# Patient Record
Sex: Female | Born: 1935 | Race: White | Hispanic: No | Smoking: Never smoker
Health system: Southern US, Community
[De-identification: ages and names within clinical notes are randomized; demographics above are authoritative.]

## PROBLEM LIST (undated history)

## (undated) DIAGNOSIS — I10 Essential (primary) hypertension: Secondary | ICD-10-CM

## (undated) DIAGNOSIS — H3530 Unspecified macular degeneration: Secondary | ICD-10-CM

## (undated) DIAGNOSIS — M810 Age-related osteoporosis without current pathological fracture: Secondary | ICD-10-CM

## (undated) DIAGNOSIS — E860 Dehydration: Secondary | ICD-10-CM

## (undated) DIAGNOSIS — M1990 Unspecified osteoarthritis, unspecified site: Secondary | ICD-10-CM

## (undated) DIAGNOSIS — H409 Unspecified glaucoma: Secondary | ICD-10-CM

## (undated) DIAGNOSIS — K279 Peptic ulcer, site unspecified, unspecified as acute or chronic, without hemorrhage or perforation: Secondary | ICD-10-CM

## (undated) DIAGNOSIS — K219 Gastro-esophageal reflux disease without esophagitis: Secondary | ICD-10-CM

## (undated) DIAGNOSIS — N289 Disorder of kidney and ureter, unspecified: Secondary | ICD-10-CM

## (undated) DIAGNOSIS — F0390 Unspecified dementia without behavioral disturbance: Secondary | ICD-10-CM

## (undated) DIAGNOSIS — I471 Supraventricular tachycardia, unspecified: Secondary | ICD-10-CM

## (undated) HISTORY — PX: CORNEAL TRANSPLANT: SHX108

## (undated) HISTORY — PX: EYE SURGERY: SHX253

## (undated) HISTORY — PX: BREAST LUMPECTOMY: SHX2

## (undated) HISTORY — PX: CATARACT EXTRACTION, BILATERAL: SHX1313

---

## 2015-05-15 ENCOUNTER — Encounter (HOSPITAL_COMMUNITY): Payer: Self-pay

## 2015-05-15 ENCOUNTER — Observation Stay (HOSPITAL_COMMUNITY): Payer: Medicare Other | Admitting: Certified Registered"

## 2015-05-15 ENCOUNTER — Encounter (HOSPITAL_COMMUNITY)
Admission: EM | Disposition: A | Discharge status: Skilled Nursing Facility | Payer: Self-pay | Source: Home / Self Care | Attending: Internal Medicine

## 2015-05-15 ENCOUNTER — Inpatient Hospital Stay (HOSPITAL_COMMUNITY)
Admission: EM | Admit: 2015-05-15 | Discharge: 2015-05-19 | DRG: 907 | Disposition: A | Discharge status: Skilled Nursing Facility | Payer: Medicare Other | Attending: Internal Medicine | Admitting: Internal Medicine

## 2015-05-15 ENCOUNTER — Emergency Department (HOSPITAL_COMMUNITY): Payer: Medicare Other

## 2015-05-15 DIAGNOSIS — T86848 Other complications of corneal transplant: Principal | ICD-10-CM | POA: Diagnosis present

## 2015-05-15 DIAGNOSIS — Y838 Other surgical procedures as the cause of abnormal reaction of the patient, or of later complication, without mention of misadventure at the time of the procedure: Secondary | ICD-10-CM | POA: Diagnosis present

## 2015-05-15 DIAGNOSIS — F0390 Unspecified dementia without behavioral disturbance: Secondary | ICD-10-CM | POA: Diagnosis present

## 2015-05-15 DIAGNOSIS — R296 Repeated falls: Secondary | ICD-10-CM

## 2015-05-15 DIAGNOSIS — R059 Cough, unspecified: Secondary | ICD-10-CM

## 2015-05-15 DIAGNOSIS — H5711 Ocular pain, right eye: Secondary | ICD-10-CM | POA: Diagnosis not present

## 2015-05-15 DIAGNOSIS — S0501XA Injury of conjunctiva and corneal abrasion without foreign body, right eye, initial encounter: Secondary | ICD-10-CM

## 2015-05-15 DIAGNOSIS — S058X1A Other injuries of right eye and orbit, initial encounter: Secondary | ICD-10-CM | POA: Diagnosis present

## 2015-05-15 DIAGNOSIS — Z79899 Other long term (current) drug therapy: Secondary | ICD-10-CM

## 2015-05-15 DIAGNOSIS — T868489 Other complications of corneal transplant, unspecified eye: Secondary | ICD-10-CM | POA: Diagnosis present

## 2015-05-15 DIAGNOSIS — H409 Unspecified glaucoma: Secondary | ICD-10-CM | POA: Diagnosis present

## 2015-05-15 DIAGNOSIS — I10 Essential (primary) hypertension: Secondary | ICD-10-CM | POA: Diagnosis not present

## 2015-05-15 DIAGNOSIS — W1830XA Fall on same level, unspecified, initial encounter: Secondary | ICD-10-CM | POA: Diagnosis present

## 2015-05-15 DIAGNOSIS — Z888 Allergy status to other drugs, medicaments and biological substances status: Secondary | ICD-10-CM

## 2015-05-15 DIAGNOSIS — S0531XA Ocular laceration without prolapse or loss of intraocular tissue, right eye, initial encounter: Secondary | ICD-10-CM | POA: Diagnosis present

## 2015-05-15 DIAGNOSIS — Z66 Do not resuscitate: Secondary | ICD-10-CM | POA: Diagnosis present

## 2015-05-15 DIAGNOSIS — K219 Gastro-esophageal reflux disease without esophagitis: Secondary | ICD-10-CM | POA: Diagnosis not present

## 2015-05-15 DIAGNOSIS — S0530XA Ocular laceration without prolapse or loss of intraocular tissue, unspecified eye, initial encounter: Secondary | ICD-10-CM | POA: Diagnosis present

## 2015-05-15 DIAGNOSIS — R05 Cough: Secondary | ICD-10-CM

## 2015-05-15 DIAGNOSIS — J181 Lobar pneumonia, unspecified organism: Secondary | ICD-10-CM | POA: Diagnosis present

## 2015-05-15 DIAGNOSIS — R451 Restlessness and agitation: Secondary | ICD-10-CM | POA: Diagnosis present

## 2015-05-15 DIAGNOSIS — G9340 Encephalopathy, unspecified: Secondary | ICD-10-CM | POA: Diagnosis present

## 2015-05-15 DIAGNOSIS — Z7982 Long term (current) use of aspirin: Secondary | ICD-10-CM

## 2015-05-15 DIAGNOSIS — D509 Iron deficiency anemia, unspecified: Secondary | ICD-10-CM | POA: Diagnosis present

## 2015-05-15 DIAGNOSIS — H5710 Ocular pain, unspecified eye: Secondary | ICD-10-CM | POA: Diagnosis present

## 2015-05-15 HISTORY — DX: Unspecified glaucoma: H40.9

## 2015-05-15 HISTORY — DX: Unspecified dementia, unspecified severity, without behavioral disturbance, psychotic disturbance, mood disturbance, and anxiety: F03.90

## 2015-05-15 HISTORY — DX: Supraventricular tachycardia: I47.1

## 2015-05-15 HISTORY — PX: RUPTURED GLOBE EXPLORATION AND REPAIR: SHX2366

## 2015-05-15 HISTORY — DX: Unspecified osteoarthritis, unspecified site: M19.90

## 2015-05-15 HISTORY — DX: Unspecified macular degeneration: H35.30

## 2015-05-15 HISTORY — DX: Supraventricular tachycardia, unspecified: I47.10

## 2015-05-15 HISTORY — DX: Peptic ulcer, site unspecified, unspecified as acute or chronic, without hemorrhage or perforation: K27.9

## 2015-05-15 HISTORY — DX: Dehydration: E86.0

## 2015-05-15 HISTORY — DX: Disorder of kidney and ureter, unspecified: N28.9

## 2015-05-15 HISTORY — DX: Age-related osteoporosis without current pathological fracture: M81.0

## 2015-05-15 HISTORY — DX: Essential (primary) hypertension: I10

## 2015-05-15 HISTORY — PX: CORNEAL LACERATION REPAIR: SHX5331

## 2015-05-15 HISTORY — DX: Gastro-esophageal reflux disease without esophagitis: K21.9

## 2015-05-15 LAB — CBC WITH DIFFERENTIAL/PLATELET
BASOS PCT: 1 %
Basophils Absolute: 0.1 10*3/uL (ref 0.0–0.1)
EOS PCT: 1 %
Eosinophils Absolute: 0.1 10*3/uL (ref 0.0–0.7)
HEMATOCRIT: 41.8 % (ref 36.0–46.0)
Hemoglobin: 13.5 g/dL (ref 12.0–15.0)
Lymphocytes Relative: 12 %
Lymphs Abs: 1.1 10*3/uL (ref 0.7–4.0)
MCH: 30.3 pg (ref 26.0–34.0)
MCHC: 32.3 g/dL (ref 30.0–36.0)
MCV: 93.9 fL (ref 78.0–100.0)
MONO ABS: 0.4 10*3/uL (ref 0.1–1.0)
MONOS PCT: 5 %
NEUTROS ABS: 7.5 10*3/uL (ref 1.7–7.7)
Neutrophils Relative %: 81 %
PLATELETS: 298 10*3/uL (ref 150–400)
RBC: 4.45 MIL/uL (ref 3.87–5.11)
RDW: 14.4 % (ref 11.5–15.5)
WBC: 9.2 10*3/uL (ref 4.0–10.5)

## 2015-05-15 LAB — I-STAT CHEM 8, ED
BUN: 16 mg/dL (ref 6–20)
CREATININE: 0.9 mg/dL (ref 0.44–1.00)
Calcium, Ion: 1.15 mmol/L (ref 1.13–1.30)
Chloride: 103 mmol/L (ref 101–111)
GLUCOSE: 135 mg/dL — AB (ref 65–99)
HEMATOCRIT: 45 % (ref 36.0–46.0)
HEMOGLOBIN: 15.3 g/dL — AB (ref 12.0–15.0)
Potassium: 4.1 mmol/L (ref 3.5–5.1)
Sodium: 141 mmol/L (ref 135–145)
TCO2: 26 mmol/L (ref 0–100)

## 2015-05-15 SURGERY — REPAIR, RUPTURE, GLOBE
Anesthesia: General | Site: Eye | Laterality: Right

## 2015-05-15 MED ORDER — FUROSEMIDE 40 MG PO TABS: 40.0000 mg | ORAL_TABLET | Freq: Every day | ORAL | Status: DC

## 2015-05-15 MED ORDER — LIDOCAINE HCL (CARDIAC) 20 MG/ML IV SOLN
INTRAVENOUS | Status: DC | PRN
  Administered 2015-05-15: 50 mg via INTRAVENOUS

## 2015-05-15 MED ORDER — FENTANYL CITRATE (PF) 100 MCG/2ML IJ SOLN: 25.0000 ug | INTRAMUSCULAR | Status: DC | PRN

## 2015-05-15 MED ORDER — ONDANSETRON HCL 4 MG/2ML IJ SOLN: 4.0000 mg | Freq: Four times a day (QID) | INTRAMUSCULAR | Status: DC | PRN

## 2015-05-15 MED ORDER — FENTANYL CITRATE (PF) 100 MCG/2ML IJ SOLN
25.0000 ug | Freq: Once | INTRAMUSCULAR | Status: AC
  Administered 2015-05-15: 25 ug via INTRAVENOUS
  Filled 2015-05-15: qty 2

## 2015-05-15 MED ORDER — CEFTAZIDIME INTRAVITREAL INJECTION 2.25 MG/0.1 ML
2.5000 mg | INTRAVITREAL | Status: AC
  Filled 2015-05-15: qty 0.11

## 2015-05-15 MED ORDER — NAPROXEN 375 MG PO TABS
375.0000 mg | ORAL_TABLET | Freq: Two times a day (BID) | ORAL | Status: DC
  Filled 2015-05-15: qty 1

## 2015-05-15 MED ORDER — LATANOPROST 0.005 % OP SOLN
1.0000 [drp] | Freq: Every day | OPHTHALMIC | Status: DC
  Administered 2015-05-15 – 2015-05-18 (×4): 1 [drp] via OPHTHALMIC
  Filled 2015-05-15: qty 2.5

## 2015-05-15 MED ORDER — PREDNISOLONE ACETATE 1 % OP SUSP
1.0000 [drp] | OPHTHALMIC | Status: DC
Start: 2015-05-16 — End: 2015-05-16

## 2015-05-15 MED ORDER — DONEPEZIL HCL 10 MG PO TABS
10.0000 mg | ORAL_TABLET | Freq: Every day | ORAL | Status: DC
  Administered 2015-05-15 – 2015-05-19 (×5): 10 mg via ORAL
  Filled 2015-05-15 (×5): qty 1

## 2015-05-15 MED ORDER — BUPIVACAINE HCL (PF) 0.75 % IJ SOLN
INTRAMUSCULAR | Status: AC
  Filled 2015-05-15: qty 10

## 2015-05-15 MED ORDER — METOPROLOL TARTRATE 50 MG PO TABS
50.0000 mg | ORAL_TABLET | Freq: Two times a day (BID) | ORAL | Status: DC
  Administered 2015-05-15 – 2015-05-19 (×9): 50 mg via ORAL
  Filled 2015-05-15 (×9): qty 1

## 2015-05-15 MED ORDER — NEOMYCIN-POLYMYXIN-DEXAMETH 3.5-10000-0.1 OP SUSP
1.0000 [drp] | Freq: Three times a day (TID) | OPHTHALMIC | Status: DC
  Administered 2015-05-16 – 2015-05-19 (×10): 1 [drp] via OPHTHALMIC
  Filled 2015-05-15: qty 5

## 2015-05-15 MED ORDER — SENNA 8.6 MG PO TABS
2.0000 | ORAL_TABLET | Freq: Every day | ORAL | Status: DC
  Administered 2015-05-15 – 2015-05-18 (×4): 17.2 mg via ORAL
  Filled 2015-05-15 (×4): qty 2

## 2015-05-15 MED ORDER — CEFTAZIDIME SUBCONJUCTIVAL INJECTION 100 MG/0.5 ML
SUBCONJUNCTIVAL | Status: DC | PRN
  Administered 2015-05-15: 25 mg via SUBCONJUNCTIVAL

## 2015-05-15 MED ORDER — OFLOXACIN 0.3 % OP SOLN
1.0000 [drp] | Freq: Four times a day (QID) | OPHTHALMIC | Status: DC
  Administered 2015-05-16 – 2015-05-19 (×14): 1 [drp] via OPHTHALMIC
  Filled 2015-05-15: qty 5

## 2015-05-15 MED ORDER — ONDANSETRON HCL 4 MG/2ML IJ SOLN
4.0000 mg | Freq: Once | INTRAMUSCULAR | Status: AC
  Administered 2015-05-15: 4 mg via INTRAVENOUS
  Filled 2015-05-15: qty 2

## 2015-05-15 MED ORDER — LIDOCAINE HCL 2 % IJ SOLN
INTRAMUSCULAR | Status: AC
  Filled 2015-05-15: qty 20

## 2015-05-15 MED ORDER — VANCOMYCIN SUBCONJUNCTIVAL INJECTION 25 MG/0.5 ML
50.0000 mg | INTRAOCULAR | Status: AC
  Filled 2015-05-15: qty 1

## 2015-05-15 MED ORDER — FENTANYL CITRATE (PF) 250 MCG/5ML IJ SOLN
INTRAMUSCULAR | Status: AC
  Filled 2015-05-15: qty 5

## 2015-05-15 MED ORDER — PHENYLEPHRINE HCL 10 MG/ML IJ SOLN
10.0000 mg | INTRAVENOUS | Status: DC | PRN
  Administered 2015-05-15: 25 ug/min via INTRAVENOUS

## 2015-05-15 MED ORDER — BUPIVACAINE HCL (PF) 0.75 % IJ SOLN
INTRAMUSCULAR | Status: DC | PRN
  Administered 2015-05-15: 3 mL

## 2015-05-15 MED ORDER — 0.9 % SODIUM CHLORIDE (POUR BTL) OPTIME
TOPICAL | Status: DC | PRN
  Administered 2015-05-15: 500 mL

## 2015-05-15 MED ORDER — SUGAMMADEX SODIUM 200 MG/2ML IV SOLN
INTRAVENOUS | Status: DC | PRN
  Administered 2015-05-15: 140 mg via INTRAVENOUS

## 2015-05-15 MED ORDER — ROCURONIUM BROMIDE 100 MG/10ML IV SOLN
INTRAVENOUS | Status: DC | PRN
  Administered 2015-05-15: 10 mg via INTRAVENOUS
  Administered 2015-05-15: 40 mg via INTRAVENOUS

## 2015-05-15 MED ORDER — NEOMYCIN-POLYMYXIN-DEXAMETH 3.5-10000-0.1 OP OINT
TOPICAL_OINTMENT | OPHTHALMIC | Status: AC
  Filled 2015-05-15: qty 3.5

## 2015-05-15 MED ORDER — PANTOPRAZOLE SODIUM 40 MG PO TBEC
40.0000 mg | DELAYED_RELEASE_TABLET | Freq: Every day | ORAL | Status: DC
  Administered 2015-05-16 – 2015-05-19 (×4): 40 mg via ORAL
  Filled 2015-05-15 (×4): qty 1

## 2015-05-15 MED ORDER — SODIUM CHLORIDE 0.9 % IV SOLN
INTRAVENOUS | Status: DC | PRN
  Administered 2015-05-15: 18:00:00 via INTRAVENOUS

## 2015-05-15 MED ORDER — ACETAMINOPHEN 325 MG PO TABS
650.0000 mg | ORAL_TABLET | Freq: Four times a day (QID) | ORAL | Status: DC | PRN
Start: 2015-05-15 — End: 2015-05-19
  Administered 2015-05-17: 650 mg via ORAL
  Filled 2015-05-15: qty 2

## 2015-05-15 MED ORDER — BSS IO SOLN
INTRAOCULAR | Status: AC
  Filled 2015-05-15: qty 15

## 2015-05-15 MED ORDER — VANCOMYCIN INTRAVITREAL INJECTION 1 MG/0.1 ML
INTRAOCULAR | Status: DC | PRN
  Administered 2015-05-15: 50 mg via INTRAVITREAL

## 2015-05-15 MED ORDER — PHENYLEPHRINE HCL 10 MG/ML IJ SOLN
INTRAMUSCULAR | Status: DC | PRN
  Administered 2015-05-15 (×2): 80 ug via INTRAVENOUS

## 2015-05-15 MED ORDER — ONDANSETRON HCL 4 MG PO TABS: 4.0000 mg | ORAL_TABLET | Freq: Four times a day (QID) | ORAL | Status: DC | PRN

## 2015-05-15 MED ORDER — FERROUS SULFATE 325 (65 FE) MG PO TABS
325.0000 mg | ORAL_TABLET | Freq: Every day | ORAL | Status: DC
  Administered 2015-05-16 – 2015-05-19 (×4): 325 mg via ORAL
  Filled 2015-05-15 (×4): qty 1

## 2015-05-15 MED ORDER — MEPERIDINE HCL 25 MG/ML IJ SOLN: 6.2500 mg | INTRAMUSCULAR | Status: DC | PRN

## 2015-05-15 MED ORDER — BSS IO SOLN
INTRAOCULAR | Status: DC | PRN
  Administered 2015-05-15: 30 mL via INTRAOCULAR

## 2015-05-15 MED ORDER — SODIUM CHLORIDE 0.9 % IV SOLN
INTRAVENOUS | Status: DC
  Administered 2015-05-15: 18:00:00 via INTRAVENOUS

## 2015-05-15 MED ORDER — LEVOFLOXACIN IN D5W 500 MG/100ML IV SOLN
500.0000 mg | Freq: Once | INTRAVENOUS | Status: AC
  Administered 2015-05-15: 500 mg via INTRAVENOUS
  Filled 2015-05-15: qty 100

## 2015-05-15 MED ORDER — LIDOCAINE HCL 2 % IJ SOLN
INTRAMUSCULAR | Status: DC | PRN
  Administered 2015-05-15: 3 mL

## 2015-05-15 MED ORDER — FENTANYL CITRATE (PF) 100 MCG/2ML IJ SOLN
INTRAMUSCULAR | Status: DC | PRN
  Administered 2015-05-15: 50 ug via INTRAVENOUS
  Administered 2015-05-15: 100 ug via INTRAVENOUS

## 2015-05-15 MED ORDER — MAGNESIUM HYDROXIDE 400 MG/5ML PO SUSP: 30.0000 mL | Freq: Every evening | ORAL | Status: DC | PRN

## 2015-05-15 MED ORDER — STERILE WATER FOR IRRIGATION IR SOLN
Status: DC | PRN
  Administered 2015-05-15: 300 mL

## 2015-05-15 MED ORDER — ONDANSETRON HCL 4 MG/2ML IJ SOLN
INTRAMUSCULAR | Status: DC | PRN
  Administered 2015-05-15: 4 mg via INTRAVENOUS

## 2015-05-15 MED ORDER — ATROPINE SULFATE 1 % OP SOLN
1.0000 [drp] | Freq: Three times a day (TID) | OPHTHALMIC | Status: DC
  Administered 2015-05-16 – 2015-05-19 (×10): 1 [drp] via OPHTHALMIC
  Filled 2015-05-15: qty 5

## 2015-05-15 MED ORDER — ACETAMINOPHEN 650 MG RE SUPP
650.0000 mg | Freq: Four times a day (QID) | RECTAL | Status: DC | PRN
Start: 2015-05-15 — End: 2015-05-19

## 2015-05-15 MED ORDER — SUGAMMADEX SODIUM 200 MG/2ML IV SOLN
INTRAVENOUS | Status: AC
  Filled 2015-05-15: qty 2

## 2015-05-15 MED ORDER — ASPIRIN EC 81 MG PO TBEC: 81.0000 mg | DELAYED_RELEASE_TABLET | Freq: Every day | ORAL | Status: DC

## 2015-05-15 MED ORDER — PROPOFOL 10 MG/ML IV BOLUS
INTRAVENOUS | Status: DC | PRN
  Administered 2015-05-15: 100 mg via INTRAVENOUS

## 2015-05-15 SURGICAL SUPPLY — 58 items
ACCESSORY FRAGMATOME (MISCELLANEOUS) IMPLANT
APPLICATOR DR MATTHEWS STRL (MISCELLANEOUS) ×3 IMPLANT
BANDAGE EYE OVAL (MISCELLANEOUS) ×3 IMPLANT
BLADE 10 SAFETY STRL DISP (BLADE) IMPLANT
BLADE EYE CATARACT 19 1.4 BEAV (BLADE) IMPLANT
BLADE MVR KNIFE 19G (BLADE) IMPLANT
BLADE SURG 15 STRL LF DISP TIS (BLADE) IMPLANT
BLADE SURG 15 STRL SS (BLADE)
CANNULA ANT CHAM MAIN (OPHTHALMIC RELATED) IMPLANT
CANNULA ANTERIOR CHAMBER 27GA (MISCELLANEOUS) ×3 IMPLANT
CANNULA DUAL BORE 23G (CANNULA) IMPLANT
CANNULA SUBRETINAL FLUID 20G (BLADE) IMPLANT
CORDS BIPOLAR (ELECTRODE) ×3 IMPLANT
COVER MAYO STAND STRL (DRAPES) IMPLANT
COVER SURGICAL LIGHT HANDLE (MISCELLANEOUS) ×3 IMPLANT
DRAPE OPHTHALMIC 77X100 STRL (CUSTOM PROCEDURE TRAY) ×3 IMPLANT
ERASER HMR WETFIELD 23G BP (MISCELLANEOUS) IMPLANT
FILTER BLUE MILLIPORE (MISCELLANEOUS) IMPLANT
GOWN STRL REUS W/ TWL LRG LVL3 (GOWN DISPOSABLE) ×2 IMPLANT
GOWN STRL REUS W/TWL LRG LVL3 (GOWN DISPOSABLE) ×4
KIT BASIN OR (CUSTOM PROCEDURE TRAY) ×3 IMPLANT
KIT PERFLUORON PROCEDURE 5ML (MISCELLANEOUS) IMPLANT
KIT ROOM TURNOVER OR (KITS) ×3 IMPLANT
KNIFE CRESCENT 1.75 EDGEAHEAD (BLADE) IMPLANT
KNIFE GRIESHABER SHARP 2.5MM (MISCELLANEOUS) IMPLANT
NEEDLE 18GX1X1/2 (RX/OR ONLY) (NEEDLE) IMPLANT
NEEDLE 25GX 5/8IN NON SAFETY (NEEDLE) IMPLANT
NEEDLE 27GAX1X1/2 (NEEDLE) IMPLANT
NEEDLE HYPO 30X.5 LL (NEEDLE) IMPLANT
NS IRRIG 1000ML POUR BTL (IV SOLUTION) ×3 IMPLANT
PACK VITRECTOMY CUSTOM (CUSTOM PROCEDURE TRAY) ×3 IMPLANT
PACK VITRECTOMY PIC MCHSVP (PACKS) IMPLANT
PAD ARMBOARD 7.5X6 YLW CONV (MISCELLANEOUS) ×6 IMPLANT
PROBE LASER ILLUM FLEX CVD 25G (OPHTHALMIC) ×3 IMPLANT
REPL STRA BRUSH NEEDLE (NEEDLE) IMPLANT
RESERVOIR BACK FLUSH (MISCELLANEOUS) IMPLANT
ROLLS DENTAL (MISCELLANEOUS) IMPLANT
SCRAPER DIAMOND DUST MEMBRANE (MISCELLANEOUS) IMPLANT
SPEAR EYE SURG WECK-CEL (MISCELLANEOUS) ×3 IMPLANT
STOPCOCK 4 WAY LG BORE MALE ST (IV SETS) IMPLANT
SUT CHROMIC 7 0 TG140 8 (SUTURE) IMPLANT
SUT ETHILON 10 0 BV100 4 (SUTURE) IMPLANT
SUT ETHILON 10 0 BV75 4 (SUTURE) IMPLANT
SUT ETHILON 10 0 CS140 6 (SUTURE) ×6 IMPLANT
SUT ETHILON 9 0 TG140 8 (SUTURE) IMPLANT
SUT SILK 2 0 (SUTURE)
SUT SILK 2-0 18XBRD TIE 12 (SUTURE) IMPLANT
SUT VICRYL 7 0 TG140 8 (SUTURE) IMPLANT
SWAB COLLECTION DEVICE MRSA (MISCELLANEOUS) IMPLANT
SYR 20CC LL (SYRINGE) IMPLANT
SYR 50ML SLIP (SYRINGE) IMPLANT
SYR 5ML LL (SYRINGE) IMPLANT
SYR TB 1ML LUER SLIP (SYRINGE) IMPLANT
SYRINGE 10CC LL (SYRINGE) IMPLANT
TOWEL OR 17X24 6PK STRL BLUE (TOWEL DISPOSABLE) ×3 IMPLANT
TUBE ANAEROBIC SPECIMEN COL (MISCELLANEOUS) IMPLANT
WATER STERILE IRR 1000ML POUR (IV SOLUTION) ×3 IMPLANT
WIPE INSTRUMENT VISIWIPE 73X73 (MISCELLANEOUS) ×3 IMPLANT

## 2015-05-15 NOTE — ED Notes (Signed)
Per [facility], Pt is coming from [facility]. Pt was getting out of bed and fell. Right eye hit the plastic trash can. Left hand had bruising and skin tear. Pt has a laceration to her right eye per EMS and facility. Pt has vomiting starting during transport. Pt has Hx of Dementia, GERD, HTN, Renal Insufficiency, Gastric ulcers, Osteoporosis. Vitals per EMS: 170/92, 94% on RA, 88 HR.

## 2015-05-15 NOTE — Anesthesia Preprocedure Evaluation (Signed)
Anesthesia Evaluation  Patient identified by MRN, date of birth, ID band Patient awake    Reviewed: Allergy & Precautions, NPO status , Patient's Chart, lab work & pertinent test results, reviewed documented beta blocker date and time   Airway Mallampati: III  TM Distance: <3 FB Neck ROM: Full  Mouth opening: Limited Mouth Opening  Dental no notable dental hx.    Pulmonary neg pulmonary ROS,    Pulmonary exam normal breath sounds clear to auscultation       Cardiovascular hypertension, Pt. on home beta blockers Normal cardiovascular exam Rhythm:Regular Rate:Normal     Neuro/Psych negative neurological ROS  negative psych ROS   GI/Hepatic Neg liver ROS, PUD, GERD  ,  Endo/Other  negative endocrine ROS  Renal/GU Renal disease     Musculoskeletal negative musculoskeletal ROS (+)   Abdominal   Peds  Hematology negative hematology ROS (+)   Anesthesia Other Findings   Reproductive/Obstetrics                             Anesthesia Physical Anesthesia Plan  ASA: II and emergent  Anesthesia Plan: General   Post-op Pain Management:    Induction: Intravenous  Airway Management Planned: Oral ETT and Video Laryngoscope Planned  Additional Equipment:   Intra-op Plan:   Post-operative Plan: Extubation in OR  Informed Consent: I have reviewed the patients History and Physical, chart, labs and discussed the procedure including the risks, benefits and alternatives for the proposed anesthesia with the patient or authorized representative who has indicated his/her understanding and acceptance.   Dental advisory given  Plan Discussed with: CRNA  Anesthesia Plan Comments:         Anesthesia Quick Evaluation

## 2015-05-15 NOTE — ED Notes (Signed)
MD made aware of patient's condition. Came to bedside to see patient

## 2015-05-15 NOTE — ED Provider Notes (Signed)
CSN: [id number]     Arrival date & time 05/15/15  0905 History   First MD Initiated Contact with Patient 05/15/15 0901     Chief Complaint  Patient presents with  . Fall   Level V caveat due to dementia Patient is a 82 y.o. female presenting with fall. The history is provided by the patient.  Fall This is a new problem.  Patient with reported fall. Patient is really without complaints maybe complaining of pain in her right eye. 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 She also has rather severe macular degeneration. She is a DO NOT RESUSCITATE. Patient did vomit for EMS.  Past Medical History  Diagnosis Date  . Renal disorder     Renal Insufficiency  . Dehydration   . GERD (gastroesophageal reflux disease)   . Peptic ulcer   . Osteoporosis     With Compression Fx  . Supraventricular tachycardia (HCC)    History reviewed. No pertinent past surgical history. History reviewed. No pertinent family history. Social History  Substance Use Topics  . Smoking status: Unknown If Ever Smoked  . Smokeless tobacco: None  . Alcohol Use: No   OB History    No data available     Review of Systems  Unable to perform ROS: Dementia      Allergies  Diltiazem; Norvasc; and Verapamil  Home Medications   Prior to Admission medications   Medication Sig Start Date End Date Taking? Authorizing Provider  aspirin EC 81 MG tablet Take 81 mg by mouth daily.   Yes Historical Provider, MD  diclofenac sodium (VOLTAREN) 1 % GEL Apply 2 g topically 4 (four) times daily as needed (pain).   Yes Historical Provider, MD  donepezil (ARICEPT) 10 MG tablet Take 10 mg by mouth daily. 04/28/15  Yes Historical Provider, MD  ferrous sulfate 325 (65 FE) MG tablet Take 325 mg by mouth daily with breakfast.   Yes Historical Provider, MD  furosemide (LASIX) 40 MG tablet Take 40 mg by mouth daily. 04/28/15  Yes Historical Provider, MD  latanoprost (XALATAN) 0.005 % ophthalmic solution Place  1 drop into both eyes at bedtime. 04/07/15  Yes Historical Provider, MD  magnesium hydroxide (MILK OF MAGNESIA) 400 MG/5ML suspension Take 30 mLs by mouth at bedtime as needed for mild constipation.   Yes Historical Provider, MD  metoprolol (LOPRESSOR) 50 MG tablet Take 50 mg by mouth every 12 (twelve) hours. 04/28/15  Yes Historical Provider, MD  NAPROXEN 375 MG TBEC EC tablet Take 375 mg by mouth 2 (two) times daily. 04/28/15  Yes Historical Provider, MD  OVER THE COUNTER MEDICATION Take 1 Can by mouth 3 (three) times daily with meals. Mighty Shakes   Yes Historical Provider, MD  pantoprazole (PROTONIX) 40 MG tablet Take 40 mg by mouth daily. 04/28/15  Yes Historical Provider, MD  potassium chloride (MICRO-K) 10 MEQ CR capsule Take 20 mEq by mouth daily. 04/28/15  Yes Historical Provider, MD  senna (SENOKOT) 8.6 MG TABS tablet Take 2 tablets by mouth at bedtime.   Yes Historical Provider, MD  SIMBRINZA 1-0.2 % SUSP Place 1 drop into both eyes 3 (three) times daily. 04/07/15  Yes Historical Provider, MD   BP 173/127 mmHg  Pulse 67  Temp(Src) 98.2 F (36.8 C) (Oral)  Resp 18  SpO2 90% Physical Exam  Constitutional: She appears well-developed.  HENT:  Abrasion over bridge of nose with mild tenderness.  Eyes:  Right cornea appears to  have avulsed  and have an open globe. extraocular movements intact. Decreased vision out of left eye which is reportedly chronic. No vision out of her right eye.  Neck: Neck supple.  Cardiovascular: Normal rate.   Pulmonary/Chest: Effort normal.  Abdominal: There is no tenderness.  Musculoskeletal:  No tenderness over extremities.  Neurological: She is alert.  Patient is at her baseline dementia.  Skin: Skin is warm.      ED Course  Procedures (including critical care time) Labs Review Labs Reviewed  I-STAT CHEM 8, ED - Abnormal; Notable for the following:    Glucose, Bld 135 (*)    Hemoglobin 15.3 (*)    All other components within normal limits  CBC  WITH DIFFERENTIAL/PLATELET    Imaging Review Ct Head Wo Contrast  05/15/2015  CLINICAL DATA:  Status post fall.  Laceration of the right eye. EXAM: CT HEAD WITHOUT CONTRAST CT MAXILLOFACIAL WITHOUT CONTRAST TECHNIQUE: Multidetector CT imaging of the head and maxillofacial structures were performed using the standard protocol without intravenous contrast. Multiplanar CT image reconstructions of the maxillofacial structures were also generated. COMPARISON:  None. FINDINGS: CT HEAD FINDINGS There is no evidence of mass effect, midline shift, or extra-axial fluid collections. There is no evidence of a space-occupying lesion or intracranial hemorrhage. There is no evidence of a cortical-based area of acute infarction. There is generalized cerebral atrophy. There is periventricular white matter low attenuation likely secondary to microangiopathy. The ventricles and sulci are appropriate for the patient's age. The basal cisterns are patent. Visualized portions of the orbits are unremarkable. The visualized portions of the paranasal sinuses and mastoid air cells are unremarkable. The osseous structures are unremarkable. CT MAXILLOFACIAL FINDINGS There is hyperdense material within the right globe most concerning for a a hemorrhagic choroidal detachment. The orbital walls are intact. The orbital floors are intact. The maxilla is intact. The mandible is intact. The zygomatic arches are intact. The nasal septum is midline. There is no nasal bone fracture. 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 There is degenerative disc disease with disc height loss at C2-3, C3-4, C4-5 and C5-6. The paranasal sinuses are clear. The visualized portions of the mastoid sinuses are well aerated. IMPRESSION: 1. No acute intracranial pathology. 2. 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 Ophthalmology consultation is recommended. Electronically Signed   By:  Elige KoHetal  Patel   On: 05/15/2015 11:15   Ct Maxillofacial Wo Cm  05/15/2015  CLINICAL DATA:  Status post fall.  Laceration of the right eye. EXAM: CT HEAD WITHOUT CONTRAST CT MAXILLOFACIAL WITHOUT CONTRAST TECHNIQUE: Multidetector CT imaging of the head and maxillofacial structures were performed using the standard protocol without intravenous contrast. Multiplanar CT image reconstructions of the maxillofacial structures were also generated. COMPARISON:  None. FINDINGS: CT HEAD FINDINGS There is no evidence of mass effect, midline shift, or extra-axial fluid collections. There is no evidence of a space-occupying lesion or intracranial hemorrhage. There is no evidence of a cortical-based area of acute infarction. There is generalized cerebral atrophy. There is periventricular white matter low attenuation likely secondary to microangiopathy. The ventricles and sulci are appropriate for the patient's age. The basal cisterns are patent. Visualized portions of the orbits are unremarkable. The visualized portions of the paranasal sinuses and mastoid air cells are unremarkable. The osseous structures are unremarkable. CT MAXILLOFACIAL FINDINGS There is hyperdense material within the right globe most concerning for a a hemorrhagic choroidal detachment. The orbital walls are intact. The orbital floors are  intact. The maxilla is intact. The mandible is intact. The zygomatic arches are intact. The nasal septum is midline. There is no nasal bone fracture. 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 There is degenerative disc disease with disc height loss at C2-3, C3-4, C4-5 and C5-6. The paranasal sinuses are clear. The visualized portions of the mastoid sinuses are well aerated. IMPRESSION: 1. No acute intracranial pathology. 2. 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 Ophthalmology consultation is recommended. Electronically Signed   By: Elige Ko   On: 05/15/2015 11:15   I have personally reviewed and evaluated these images and lab results as part of my medical decision-making.   EKG Interpretation None      MDM   Final diagnoses:  Other complication of corneal transplant    Patient with fall and open globe. Previous corneal transplant. Seen in the ER by Dr. Dione Booze. He recommends transfer to [facility]. Discussed with [facility] Dr.Rubino who discussed with Dr. Dione Booze and recommends no transfer to [facility]. Then discussed with [facility], discussed with Dr. Modesto Charon who also discussed with Dr. Dione Booze and reportedly wants patient to stay here. Dr. Dione Booze will take the patient to the operating room.    Benjiman Core, MD 05/15/15 1247

## 2015-05-15 NOTE — Anesthesia Postprocedure Evaluation (Signed)
Anesthesia Post Note  Patient: Crystal RuffHilda Hartman  Procedure(s) Performed: Procedure(s) (LRB): TRAUMATIC OPEN GLOBE REPAIR (Right)  Patient location during evaluation: PACU Anesthesia Type: General Level of consciousness: awake and alert Pain management: pain level controlled Vital Signs Assessment: post-procedure vital signs reviewed and stable Respiratory status: spontaneous breathing, nonlabored ventilation, respiratory function stable and patient connected to nasal cannula oxygen Cardiovascular status: blood pressure returned to baseline and stable Postop Assessment: no signs of nausea or vomiting Anesthetic complications: no    Last Vitals:  Filed Vitals:   05/15/15 2048 05/15/15 2115  BP: 155/85 166/88  Pulse:  93  Temp: 36.7 C   Resp: 19 19    Last Pain:  Filed Vitals:   05/15/15 2119  PainSc: 0-No pain                 [name]

## 2015-05-15 NOTE — Op Note (Signed)
Ophthalmology Operative Report  Leighton RuffHilda Crevier 02/22/1918 82 year old female  Date of Surgery: 03/17/2015  Surgeon: Olivia Canterichard Scott Yaritsa Savarino, MD  Background/Indications: Ms. Rod CanBrower is a 82 year old woman with a complex ocular history that includes bilateral penetrating keratoplasty 20+ years ago who presents s/p trauma in which case she sustained near-complete dehiscence of the corneal graft in her right eye and expulsion of intraocular contents 2/2 mechanical fall. The laceration was exactly located at the graft-host interface from 3-12 o'clock. On initial exam, she was noted to have no light perception visual acuity OD and large rAPD by reverse. There was no view of the anterior chamber, as the intraocular contents were expulsed and located on her upper mid-face. Her primary ophthalmologist, Georgann Housekeeperobert Handley, was notified, and it became clear that she had very poor vision OD from longstanding glaucoma and macular degeneration. After discussion with Dr. Marina GravelHandley, and the patient's son Van Clines(Phillip Duval), the decision was made to perform globe repair versus primary evisceration. The risks, benefits, and alternatives to surgery were discussed with her son (power-of-attorney), and he elected that she undergo surgical repair with evisceration if adequate repair could not be achieved.   Procedures:  1) Corneal laceration repair, right eye  Anesthesia:  GETA with MAC  Complications:  None   Description:  Pt was brought into the surgical suite, and general anesthesia was administered by the anesthesia team. She was prepped and draped in the usual sterile fashion for ophthalmic surgery. Westcotts and 0.12s were used to carefully excise exposed uvea in order to get better visualization of the cornea graft and host. Remaining uvea was carefully reposited back into the eye. Next, the eye was closed with 10-0 nylon sutures. Nine sutures were replaced radially from about 4-12 o'clock roughly equidistant. Tension was  titrated by way of a slip-knot, and, when deemed adequate, all knots were rotated. The eye was then pressurized with BSS on a cannula and noted to be watertight at normotensive pressure. Next, roughly 0.25 mL of vancomycin (50 mg/mL) and 0.25 mL of ceftazadime (25 mg/mL) were injected into the eye via cannula. The lid speculum and drape were then removed, and roughly 5 mL block (1:1 lidocaine 2% and bupivicaine 0.75%) were adminstered into the retro- and peribulbar space. Antibiotic/steroid ointment was placed on the eye, and a pressure patch and shield was placed. The patient was then awakened and transferred to the PACU in stable condition.  Post-op Instructions  Pt okay for discharge from ophthalmology's perspective once deemed stable by hospitalist service (appreciate their assistance in the care of this pleasant 82 year old woman). Pt will need to be discharged on:  1) Prednisolone acetate 1% solution 1 drop right eye q4H  2) Ocuflox ophthalmic solution 1 drop right eye QID  3) Atropine 1% ophthalmic solution 1 drop right eye TID  4) Maxitrol (generic) ointment (with patient from OR) small amount right eye TID  Please feel free to notify me by cell phone at [phone] at any time if there are any questions. I will plan on seeing the patient tomorrow at 0700AM prior to discharge.  R Fabian SharpScott Cotton Beckley, MD   [address] [place]  [address]  [place], [place] [id number]

## 2015-05-15 NOTE — ED Notes (Signed)
Opthamology at the bedside.

## 2015-05-15 NOTE — H&P (Signed)
[facility] History and Physical   Patient: Crystal Hartman ZOX:[id number]   PCP: Marylen PontoHOLT,LYNLEY S, MD DOB: 12/31/1918   DOA: 05/15/2015   DOS: 05/15/2015   DOS: the patient was seen and examined on 05/15/2015  Referring physician: ER Chief Complaint: Fall with injury to right eye  HPI: Crystal Hartman is a 82 y.o. female with Past medical history of dementia, GERD, SVT. The patient presented with complaints of a fall. The history is limited as is the patient has dementia. As per the family they were called since the patient had a fall. As per the documentation the patient had a mechanical fall with injury to her right eye due to a plastic can. Patient apparently had right globe injury. [facility] as well as do ophthalmology here decided to take the patient to or. The patient underwent the procedure and the patient was seen after the procedure. The patient tolerated the procedure very well. Denies having any complaints of chest pain or abdominal pain nausea or vomiting diarrhea or back pain. Denies having any focal deficit as well. Family denies any recent change in her medication. Patient lives alone at an assisted living facility  The patient is coming from ALF.  At her baseline ambulates Occasionally with walker And is independent for most of her ADL; manages her medication on her own.  Review of Systems: as mentioned in the history of present illness.  A comprehensive review of the other systems is negative.  Past Medical History  Diagnosis Date  . Renal disorder     Renal Insufficiency  . Dehydration   . GERD (gastroesophageal reflux disease)   . Peptic ulcer   . Osteoporosis     With Compression Fx  . Supraventricular tachycardia (HCC)    History reviewed. No pertinent past surgical history. Social History:  reports that she does not drink alcohol or use illicit drugs. Her tobacco history is not on file.  Allergies  Allergen Reactions  .  Diltiazem   . Norvasc [Amlodipine Besylate]   . Verapamil     History reviewed. No pertinent family history.  Prior to Admission medications   Medication Sig Start Date End Date Taking? Authorizing Provider  aspirin EC 81 MG tablet Take 81 mg by mouth daily.   Yes Historical Provider, MD  diclofenac sodium (VOLTAREN) 1 % GEL Apply 2 g topically 4 (four) times daily as needed (pain).   Yes Historical Provider, MD  donepezil (ARICEPT) 10 MG tablet Take 10 mg by mouth daily. 04/28/15  Yes Historical Provider, MD  ferrous sulfate 325 (65 FE) MG tablet Take 325 mg by mouth daily with breakfast.   Yes Historical Provider, MD  furosemide (LASIX) 40 MG tablet Take 40 mg by mouth daily. 04/28/15  Yes Historical Provider, MD  latanoprost (XALATAN) 0.005 % ophthalmic solution Place 1 drop into both eyes at bedtime. 04/07/15  Yes Historical Provider, MD  magnesium hydroxide (MILK OF MAGNESIA) 400 MG/5ML suspension Take 30 mLs by mouth at bedtime as needed for mild constipation.   Yes Historical Provider, MD  metoprolol (LOPRESSOR) 50 MG tablet Take 50 mg by mouth every 12 (twelve) hours. 04/28/15  Yes Historical Provider, MD  NAPROXEN 375 MG TBEC EC tablet Take 375 mg by mouth 2 (two) times daily. 04/28/15  Yes Historical Provider, MD  OVER THE COUNTER MEDICATION Take 1 Can by mouth 3 (three) times daily with meals. Mighty Shakes   Yes Historical Provider, MD  pantoprazole (PROTONIX) 40 MG tablet  Take 40 mg by mouth daily. 04/28/15  Yes Historical Provider, MD  potassium chloride (MICRO-K) 10 MEQ CR capsule Take 20 mEq by mouth daily. 04/28/15  Yes Historical Provider, MD  senna (SENOKOT) 8.6 MG TABS tablet Take 2 tablets by mouth at bedtime.   Yes Historical Provider, MD  SIMBRINZA 1-0.2 % SUSP Place 1 drop into both eyes 3 (three) times daily. 04/07/15  Yes Historical Provider, MD    Physical Exam: Filed Vitals:   05/15/15 2048 05/15/15 2115 05/15/15 2125 05/15/15 2149  BP: 155/85 166/88 151/95 183/82    Pulse:  93 92 91  Temp: 98 F (36.7 C)  98 F (36.7 C) 98.5 F (36.9 C)  TempSrc:      Resp: SpO2: 92% 95% 96% 94%    General: Alert, Awake and Oriented to Time, Place and Person. Appear in mild distress Eyes: Right eye under patch ENT: Oral Mucosa clear moist. Neck: no JVD Cardiovascular: S1 and S2 Present, no Murmur, Peripheral Pulses Present Respiratory: Bilateral Air entry equal and Decreased,  Clear to Auscultation, no Crackles, no wheezes Abdomen: Bowel Sound present, Soft and no tenderness Skin: no Rash Extremities: no Pedal edema, no calf tenderness Neurologic: Grossly no focal neuro deficit. Labs on Admission:  CBC:  Recent Labs Lab 05/15/15 0912 05/15/15 0947  WBC 9.2  --   NEUTROABS 7.5  --   HGB 13.5 15.3*  HCT 41.8 45.0  MCV 93.9  --   PLT 298  --     CMP     Component Value Date/Time   NA 141 05/15/2015 0947   K 4.1 05/15/2015 0947   CL 103 05/15/2015 0947   GLUCOSE 135* 05/15/2015 0947   BUN 16 05/15/2015 0947   CREATININE 0.90 05/15/2015 0947    No results for input(s): CKTOTAL, CKMB, CKMBINDEX, TROPONINI in the last 168 hours. BNP (last 3 results) No results for input(s): BNP in the last 8760 hours.  ProBNP (last 3 results) No results for input(s): PROBNP in the last 8760 hours.   Radiological Exams on Admission: Ct Head Wo Contrast  05/15/2015  CLINICAL DATA:  Status post fall.  Laceration of the right eye. EXAM: CT HEAD WITHOUT CONTRAST CT MAXILLOFACIAL WITHOUT CONTRAST TECHNIQUE: Multidetector CT imaging of the head and maxillofacial structures were performed using the standard protocol without intravenous contrast. Multiplanar CT image reconstructions of the maxillofacial structures were also generated. COMPARISON:  None. FINDINGS: CT HEAD FINDINGS There is no evidence of mass effect, midline shift, or extra-axial fluid collections. There is no evidence of a space-occupying lesion or intracranial hemorrhage. There is no  evidence of a cortical-based area of acute infarction. There is generalized cerebral atrophy. There is periventricular white matter low attenuation likely secondary to microangiopathy. The ventricles and sulci are appropriate for the patient's age. The basal cisterns are patent. Visualized portions of the orbits are unremarkable. The visualized portions of the paranasal sinuses and mastoid air cells are unremarkable. The osseous structures are unremarkable. CT MAXILLOFACIAL FINDINGS There is hyperdense material within the right globe most concerning for a a hemorrhagic choroidal detachment. The orbital walls are intact. The orbital floors are intact. The maxilla is intact. The mandible is intact. The zygomatic arches are intact. The nasal septum is midline. There is no nasal bone fracture. 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 There is degenerative disc disease with disc height loss at C2-3, C3-4, C4-5 and C5-6. The paranasal sinuses are clear. The visualized  portions of the mastoid sinuses are well aerated. IMPRESSION: 1. No acute intracranial pathology. 2. 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 Ophthalmology consultation is recommended. Electronically Signed   By: Elige Ko   On: 05/15/2015 11:15   Ct Maxillofacial Wo Cm  05/15/2015  CLINICAL DATA:  Status post fall.  Laceration of the right eye. EXAM: CT HEAD WITHOUT CONTRAST CT MAXILLOFACIAL WITHOUT CONTRAST TECHNIQUE: Multidetector CT imaging of the head and maxillofacial structures were performed using the standard protocol without intravenous contrast. Multiplanar CT image reconstructions of the maxillofacial structures were also generated. COMPARISON:  None. FINDINGS: CT HEAD FINDINGS There is no evidence of mass effect, midline shift, or extra-axial fluid collections. There is no evidence of a space-occupying lesion or intracranial hemorrhage. There is no  evidence of a cortical-based area of acute infarction. There is generalized cerebral atrophy. There is periventricular white matter low attenuation likely secondary to microangiopathy. The ventricles and sulci are appropriate for the patient's age. The basal cisterns are patent. Visualized portions of the orbits are unremarkable. The visualized portions of the paranasal sinuses and mastoid air cells are unremarkable. The osseous structures are unremarkable. CT MAXILLOFACIAL FINDINGS There is hyperdense material within the right globe most concerning for a a hemorrhagic choroidal detachment. The orbital walls are intact. The orbital floors are intact. The maxilla is intact. The mandible is intact. The zygomatic arches are intact. The nasal septum is midline. There is no nasal bone fracture. 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 There is degenerative disc disease with disc height loss at C2-3, C3-4, C4-5 and C5-6. The paranasal sinuses are clear. The visualized portions of the mastoid sinuses are well aerated. IMPRESSION: 1. No acute intracranial pathology. 2. 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 Ophthalmology consultation is recommended. Electronically Signed   By: Elige Ko   On: 05/15/2015 11:15   EKG: Independently reviewed. normal sinus rhythm, nonspecific ST and T waves changes.  Assessment/Plan 1. Corneal injury of right eye Secondary to mechanical fall. CT scan of the maxillofacial area does not show any acute fracture. Appreciate input from ophthalmology. The patient has been taken for corneal tear repair. Medication as per recommendation has been ordered. We will follow further change in plan from ophthalmology if needed.  2. Mechanical fall. Recurrent fall. PTOT consultation in the morning. Next and patient will likely need to be transferred to SNF on discharge.  3. GERD. Continuing PPI.  4.  Essential hypertension. Suspected diastolic dysfunction. Continue metoprolol hold Lasix. Also hold aspirin at present.  5. Dementia. No behavior disturbances. Continue Aricept.  Nutrition: Regular diet DVT Prophylaxis: mechanical compression device  Advance goals of care discussion: DNR/DNI   Consults: Ophthalmology  Family Communication: family was present at bedside, at the time of interview.  Opportunity was given to ask question and all questions were answered satisfactorily.  Disposition: Admitted as observation, med-surge unit.  Author: Lynden Oxford, MD [facility] Pager: [phone] 05/15/2015  If 7PM-7AM, please contact night-coverage [email or web address] Password [email or web address]

## 2015-05-15 NOTE — Consult Note (Addendum)
Ophthalmology Initial Consult Note  Leighton RuffBrower,Demeka, 82 y.o. female Date of Service:  05/15/2015  Requesting physician: Benjiman CoreNathan Pickering, MD  Information Obtained from: son Chief Complaint:  Open globe OD  HPI/Discussion:  Leighton RuffHilda Forge is a 82 y.o. female who [facility]p mechanical fall in which she struck directly on her right eye --> traumatic open globe OD. 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 Per report from the ER, it looks like her right cornea is hanging on by a thread, and the ocular contents are expulsed. She has only mild discomfort. She is not sure whether she can see out of the right eye. She has no complaints OS.  Past Ocular Hx:  Glaucoma, ARMD, pseudophakia, s/p PKP OU Ocular Meds: deferred Family ocular history: noncontributory  Past Medical History  Diagnosis Date  . Renal disorder     Renal Insufficiency  . Dehydration   . GERD (gastroesophageal reflux disease)   . Peptic ulcer   . Osteoporosis     With Compression Fx  . Supraventricular tachycardia (HCC)    History reviewed. No pertinent past surgical history.  Prior to Admission Meds:  Current facility-administered medications:  .  levofloxacin (LEVAQUIN) IVPB 500 mg, 500 mg, Intravenous, Once, Benjiman CoreNathan Pickering, MD  Current outpatient prescriptions:  .  aspirin EC 81 MG tablet, Take 81 mg by mouth daily., Disp: , Rfl:  .  diclofenac sodium (VOLTAREN) 1 % GEL, Apply 2 g topically 4 (four) times daily as needed (pain)., Disp: , Rfl:  .  donepezil (ARICEPT) 10 MG tablet, Take 10 mg by mouth daily., Disp: , Rfl:  .  ferrous sulfate 325 (65 FE) MG tablet, Take 325 mg by mouth daily with breakfast., Disp: , Rfl:  .  furosemide (LASIX) 40 MG tablet, Take 40 mg by mouth daily., Disp: , Rfl:  .  latanoprost (XALATAN) 0.005 % ophthalmic solution, Place 1 drop into both eyes at bedtime., Disp: , Rfl:  .  magnesium hydroxide (MILK OF MAGNESIA) 400 MG/5ML  suspension, Take 30 mLs by mouth at bedtime as needed for mild constipation., Disp: , Rfl:  .  metoprolol (LOPRESSOR) 50 MG tablet, Take 50 mg by mouth every 12 (twelve) hours., Disp: , Rfl:  .  NAPROXEN 375 MG TBEC EC tablet, Take 375 mg by mouth 2 (two) times daily., Disp: , Rfl:  .  OVER THE COUNTER MEDICATION, Take 1 Can by mouth 3 (three) times daily with meals. Mighty Shakes, Disp: , Rfl:  .  pantoprazole (PROTONIX) 40 MG tablet, Take 40 mg by mouth daily., Disp: , Rfl:  .  potassium chloride (MICRO-K) 10 MEQ CR capsule, Take 20 mEq by mouth daily., Disp: , Rfl:  .  senna (SENOKOT) 8.6 MG TABS tablet, Take 2 tablets by mouth at bedtime., Disp: , Rfl:  .  SIMBRINZA 1-0.2 % SUSP, Place 1 drop into both eyes 3 (three) times daily., Disp: , Rfl:    Inpatient Meds: None listed  Allergies  Allergen Reactions  . Diltiazem   . Norvasc [Amlodipine Besylate]   . Verapamil    Social History  Substance Use Topics  . Smoking status: Unknown If Ever Smoked  . Smokeless tobacco: Not on file  . Alcohol Use: No   History reviewed. No pertinent family history.  ROS: Other than ROS in the HPI, all other systems were negative.  Exam: Temp: 98.2 F (36.8 C) Pulse Rate: 91 BP: 176/93 mmHg Resp: (!) 32 SpO2:  91 %  Visual Acuity:  near   OD NLP   OS 20/60- (with readers)     OD OS  Confr Vis Fields Complete scotoma Grossly full (difficult)  EOM (Primary) Not formally tested Not formally tested  Lids/Lashes WNL WNL  Conjunctiva - Bulbar Normal Normal  Conjunctiva - Palpebral               Normal Normal  Adnexa  Small hematoma superonasally Normal  Pupils  No view, large rAPD (by reverse) 2.5 --> 2, sluggish, no rAPD  Cornea  Corneal button intact but detached from 3-9 o'clock Graft clear  Anterior Chamber No view, intraocular contents expulsed Formed, grossly quiet  Lens:  No view IOL  IOP Deferred 15  Fundus - Dilated? YES (OS only)   Optic Disc - C:D Ratio No view 0.8, somewhat  pale  Post Seg:  Retina                    Vessels No view Normal caliber                  Vitreous  No view Clear                  Macula No view Drusen, geographic atrophy                  Periphery No view Normal, drusen       Neuro:  Oriented to person, place, and time:  Yes Psychiatric:  Mood and Affect Appropriate:  Yes  Labs/imaging:   A/P:  82 y.o. 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  1) Traumatic open globe OD - Corneal button is intact, but completely detached from 3-12 o'clock. - Intraocular contents are expulsed and eye is deformed. - NLP - Discussed extensively with pt's son and her regular ophthalmologist [facility]), and there was consensus that the best treatment option was enucleation vs evisceration. - Recommend referral to [facility] for evaluation and subsequent care. Additionally, her PKPs were done at [facility] many years ago.  2) Glaucoma - OS per her regular ophthalmologist 3) ARMD - per her regular ophthalmologist  If referral cannot be arranged, then can schedule patient for repair with me later this evening.  Baldwin Jamaica, MD 05/15/2015, 10:11 AM  Will schedule patient for open globe repair right eye for 1800 tonight. Obtained verbal consent from patient's son by phone. Explained that the main risks include pain, infection, poor cosmetic outcome, inability to close the eye, need for additional surgery, and the remote risk of damage to the fellow eye (sympathetic ophthalmia). As power of attorney, he expressed understanding and agreed to proceed with surgery.  Will give intravitreal vancomycin (50 mg/mL in 1 cc syringe with 30G needle) and ceftazadime (25 mg/mL in 1 cc syringe with 30G needle). Okay to use default pharmacy concentrations for both medications.  R Fabian Sharp, MD

## 2015-05-15 NOTE — ED Notes (Signed)
Called CT to inform them pt is ready for CT scan.

## 2015-05-15 NOTE — Anesthesia Procedure Notes (Signed)
Procedure Name: Intubation Date/Time: 05/15/2015 6:44 PM Performed by: Gwenyth AllegraADAMI, Saysha Menta Pre-anesthesia Checklist: Patient identified, Timeout performed, Emergency Drugs available, Suction available and Patient being monitored Patient Re-evaluated:Patient Re-evaluated prior to inductionOxygen Delivery Method: Circle system utilized Preoxygenation: Pre-oxygenation with 100% oxygen Intubation Type: IV induction and Cricoid Pressure applied Number of attempts: 1 Airway Equipment and Method: Video-laryngoscopy and Stylet Placement Confirmation: ETT inserted through vocal cords under direct vision,  breath sounds checked- equal and bilateral and positive ETCO2 Secured at: 20 cm Dental Injury: Teeth and Oropharynx as per pre-operative assessment

## 2015-05-15 NOTE — ED Provider Notes (Signed)
Dr. Dione BoozeGroat to take to OR at 6 pm. Requests hospitalist to admit. Unassigned admission. Dr. Izola PriceMyers to admit to obs, likely d/c in AM if doing well post-op  Pricilla LovelessScott Viridiana Spaid, MD 05/15/15 [phone]

## 2015-05-15 NOTE — Transfer of Care (Signed)
Immediate Anesthesia Transfer of Care Note  Patient: Crystal Hartman  Procedure(s) Performed: Procedure(s): TRAUMATIC OPEN GLOBE REPAIR (Right)  Patient Location: PACU  Anesthesia Type:General  Level of Consciousness: awake, alert  and oriented  Airway & Oxygen Therapy: Patient Spontanous Breathing and Patient connected to nasal cannula oxygen  Post-op Assessment: Report given to RN and Post -op Vital signs reviewed and stable  Post vital signs: Reviewed and stable  Last Vitals:  Filed Vitals:   05/15/15 1645 05/15/15 2048  BP: 175/76 155/85  Pulse: 96   Temp:  36.7 C  Resp:  19    Complications: No apparent anesthesia complications

## 2015-05-16 ENCOUNTER — Observation Stay (HOSPITAL_COMMUNITY): Payer: Medicare Other

## 2015-05-16 ENCOUNTER — Encounter (HOSPITAL_COMMUNITY): Payer: Self-pay | Admitting: General Practice

## 2015-05-16 DIAGNOSIS — K219 Gastro-esophageal reflux disease without esophagitis: Secondary | ICD-10-CM | POA: Diagnosis present

## 2015-05-16 DIAGNOSIS — R296 Repeated falls: Secondary | ICD-10-CM | POA: Diagnosis not present

## 2015-05-16 DIAGNOSIS — G9340 Encephalopathy, unspecified: Secondary | ICD-10-CM

## 2015-05-16 DIAGNOSIS — Z66 Do not resuscitate: Secondary | ICD-10-CM | POA: Diagnosis present

## 2015-05-16 DIAGNOSIS — S0501XD Injury of conjunctiva and corneal abrasion without foreign body, right eye, subsequent encounter: Secondary | ICD-10-CM | POA: Diagnosis not present

## 2015-05-16 DIAGNOSIS — I10 Essential (primary) hypertension: Secondary | ICD-10-CM

## 2015-05-16 DIAGNOSIS — Z7982 Long term (current) use of aspirin: Secondary | ICD-10-CM | POA: Diagnosis not present

## 2015-05-16 DIAGNOSIS — Z888 Allergy status to other drugs, medicaments and biological substances status: Secondary | ICD-10-CM | POA: Diagnosis not present

## 2015-05-16 DIAGNOSIS — Z79899 Other long term (current) drug therapy: Secondary | ICD-10-CM | POA: Diagnosis not present

## 2015-05-16 DIAGNOSIS — W1830XA Fall on same level, unspecified, initial encounter: Secondary | ICD-10-CM | POA: Diagnosis present

## 2015-05-16 DIAGNOSIS — S0531XD Ocular laceration without prolapse or loss of intraocular tissue, right eye, subsequent encounter: Secondary | ICD-10-CM | POA: Diagnosis not present

## 2015-05-16 DIAGNOSIS — S058X1A Other injuries of right eye and orbit, initial encounter: Secondary | ICD-10-CM | POA: Diagnosis present

## 2015-05-16 DIAGNOSIS — H409 Unspecified glaucoma: Secondary | ICD-10-CM | POA: Diagnosis present

## 2015-05-16 DIAGNOSIS — S0531XA Ocular laceration without prolapse or loss of intraocular tissue, right eye, initial encounter: Secondary | ICD-10-CM | POA: Diagnosis present

## 2015-05-16 DIAGNOSIS — S0530XA Ocular laceration without prolapse or loss of intraocular tissue, unspecified eye, initial encounter: Secondary | ICD-10-CM | POA: Diagnosis present

## 2015-05-16 DIAGNOSIS — Y838 Other surgical procedures as the cause of abnormal reaction of the patient, or of later complication, without mention of misadventure at the time of the procedure: Secondary | ICD-10-CM | POA: Diagnosis present

## 2015-05-16 DIAGNOSIS — T86848 Other complications of corneal transplant: Secondary | ICD-10-CM | POA: Diagnosis present

## 2015-05-16 DIAGNOSIS — F0390 Unspecified dementia without behavioral disturbance: Secondary | ICD-10-CM | POA: Diagnosis present

## 2015-05-16 DIAGNOSIS — D509 Iron deficiency anemia, unspecified: Secondary | ICD-10-CM | POA: Diagnosis present

## 2015-05-16 DIAGNOSIS — J181 Lobar pneumonia, unspecified organism: Secondary | ICD-10-CM | POA: Diagnosis present

## 2015-05-16 DIAGNOSIS — R451 Restlessness and agitation: Secondary | ICD-10-CM | POA: Diagnosis present

## 2015-05-16 LAB — BASIC METABOLIC PANEL
Anion gap: 11 (ref 5–15)
BUN: 14 mg/dL (ref 6–20)
CHLORIDE: 103 mmol/L (ref 101–111)
CO2: 24 mmol/L (ref 22–32)
Calcium: 8.8 mg/dL — ABNORMAL LOW (ref 8.9–10.3)
Creatinine, Ser: 1.1 mg/dL — ABNORMAL HIGH (ref 0.44–1.00)
GFR calc non Af Amer: 42 mL/min — ABNORMAL LOW (ref >60)
GFR, EST AFRICAN AMERICAN: 48 mL/min — AB (ref >60)
Glucose, Bld: 110 mg/dL — ABNORMAL HIGH (ref 65–99)
POTASSIUM: 3.9 mmol/L (ref 3.5–5.1)
Sodium: 138 mmol/L (ref 135–145)

## 2015-05-16 LAB — CBC
HCT: 38.4 % (ref 36.0–46.0)
HEMOGLOBIN: 12.4 g/dL (ref 12.0–15.0)
MCH: 30.5 pg (ref 26.0–34.0)
MCHC: 32.3 g/dL (ref 30.0–36.0)
MCV: 94.6 fL (ref 78.0–100.0)
Platelets: 279 10*3/uL (ref 150–400)
RBC: 4.06 MIL/uL (ref 3.87–5.11)
RDW: 14.6 % (ref 11.5–15.5)
WBC: 18.1 10*3/uL — AB (ref 4.0–10.5)

## 2015-05-16 LAB — MAGNESIUM: Magnesium: 1.8 mg/dL (ref 1.7–2.4)

## 2015-05-16 MED ORDER — PHENYLEPHRINE 40 MCG/ML (10ML) SYRINGE FOR IV PUSH (FOR BLOOD PRESSURE SUPPORT)
PREFILLED_SYRINGE | INTRAVENOUS | Status: AC
  Filled 2015-05-16: qty 40

## 2015-05-16 MED ORDER — DEXTROSE 5 % IV SOLN
1.0000 g | INTRAVENOUS | Status: DC
  Administered 2015-05-16 – 2015-05-19 (×4): 1 g via INTRAVENOUS
  Filled 2015-05-16 (×4): qty 10

## 2015-05-16 MED ORDER — OFLOXACIN 0.3 % OP SOLN: 1.0000 [drp] | Freq: Four times a day (QID) | OPHTHALMIC | Status: DC

## 2015-05-16 MED ORDER — EPHEDRINE SULFATE 50 MG/ML IJ SOLN
INTRAMUSCULAR | Status: AC
  Filled 2015-05-16: qty 1

## 2015-05-16 MED ORDER — DEXTROSE 5 % IV SOLN
500.0000 mg | INTRAVENOUS | Status: DC
  Administered 2015-05-16 – 2015-05-18 (×3): 500 mg via INTRAVENOUS
  Filled 2015-05-16 (×4): qty 500

## 2015-05-16 MED ORDER — PREDNISOLONE ACETATE 1 % OP SUSP
1.0000 [drp] | OPHTHALMIC | Status: DC
  Administered 2015-05-16 – 2015-05-19 (×18): 1 [drp] via OPHTHALMIC
  Filled 2015-05-16: qty 5

## 2015-05-16 MED ORDER — GLYCOPYRROLATE 0.2 MG/ML IJ SOLN
INTRAMUSCULAR | Status: AC
  Filled 2015-05-16: qty 1

## 2015-05-16 MED ORDER — METOPROLOL TARTRATE 1 MG/ML IV SOLN
5.0000 mg | Freq: Once | INTRAVENOUS | Status: AC
  Administered 2015-05-16: 5 mg via INTRAVENOUS
  Filled 2015-05-16: qty 5

## 2015-05-16 MED ORDER — LIDOCAINE HCL (CARDIAC) 20 MG/ML IV SOLN
INTRAVENOUS | Status: AC
  Filled 2015-05-16: qty 5

## 2015-05-16 MED ORDER — CEFAZOLIN SODIUM 1 G IJ SOLR
INTRAMUSCULAR | Status: AC
  Filled 2015-05-16: qty 30

## 2015-05-16 MED ORDER — SUCCINYLCHOLINE CHLORIDE 20 MG/ML IJ SOLN
INTRAMUSCULAR | Status: AC
  Filled 2015-05-16: qty 1

## 2015-05-16 MED ORDER — SODIUM CHLORIDE 0.9 % IV SOLN
INTRAVENOUS | Status: AC
  Administered 2015-05-16: 11:00:00 via INTRAVENOUS

## 2015-05-16 MED ORDER — ATROPINE SULFATE 1 % OP SOLN: 1.0000 [drp] | Freq: Three times a day (TID) | OPHTHALMIC | Status: DC

## 2015-05-16 NOTE — Progress Notes (Signed)
[facility] PROGRESS NOTE  Crystal Hartman ZOX:[id number] DOB: 1918/09/14 DOA: 05/15/2015 PCP: Marylen Ponto, MD  Brief narrative 82 year old female with history of mild-to-moderate dementia, GERD, SVTs present of assist living presented to the ED after sustaining a fall. She sustained injury to her right eye after hitting a plastic can. Patient was found to have a right corneal laceration. Ophthalmology consulted and taken to or for repair. Post operatively patient has been extremely confused. Found to have increased leukocytosis and possible pneumonia.   Assessment/Plan: Right corneal laceration Status post repair. Seen by ophthalmology this morning and appears stable. Have placed her on (Atropine, prednisone and maxitrol)  eyedrops. They plan to see her in the office in one week.  Acute encephalopathy Suspect due to left lobar pneumonia. Patient extremely confused and not oriented. (At baseline she is quite mobile and communicative despite having underlying mild to moderate dementia). Had significant leukocytosis this morning and chest x-ray showing left lobar consolidation. Family reported that she has been coughing for the past few days. Would place her empirically on IV Rocephin and azithromycin. Check blood culture. Ordered UA and urine culture. Gentle IV hydration.  Essential hypertension with History of SVTs Continue metoprolol. Holding Lasix. Also held aspirin on admission.  Mild to moderate dementia Continue Aricept.   Diet: Regular  DVT prophylaxis  Code Status: DO NOT RESUSCITATE Family Communication: Son at bedside Disposition Plan: At evaluation. May need skilled nursing facility.   Consultants:  Ophthalmology  Procedures:  CT head and maxillofacial  Right corneal laceration repair  Antibiotics: IV vancomycin and cefepime 1  IV Rocephin and azithromycin 4/7  HPI/Subjective: Seen and examined. Patient is restless and very confused.  Objective: Filed  Vitals:   05/16/15 0621 05/16/15 1024  BP:    Pulse: 88 69  Temp:    Resp:      Intake/Output Summary (Last 24 hours) at 05/16/15 1303 Last data filed at 05/16/15 0903  Gross per 24 hour  Intake    460 ml  Output      0 ml  Net    460 ml   There were no vitals filed for this visit.  Exam:   General:  Elderly female lying in bed appears very confused  HEENT: Patch over her right eye, no pallor, moist mucosa  Cardiovascular: Normal S1 and S2, no murmurs rub or gallop  Respiratory: Clear bilaterally   Abdomen: soft, nondistended, nontender, bowel sounds present  Musculoskeletal: Warm, no edema   CNS: AAO 0  Data Reviewed: Basic Metabolic Panel:  Recent Labs Lab 05/15/15 0947 05/16/15 0533  NA 141 138  K 4.1 3.9  CL 103 103  CO2  --  24  GLUCOSE 135* 110*  BUN 16 14  CREATININE 0.90 1.10*  CALCIUM  --  8.8*   Liver Function Tests: No results for input(s): AST, ALT, ALKPHOS, BILITOT, PROT, ALBUMIN in the last 168 hours. No results for input(s): LIPASE, AMYLASE in the last 168 hours. No results for input(s): AMMONIA in the last 168 hours. CBC:  Recent Labs Lab 05/15/15 0912 05/15/15 0947 05/16/15 0533  WBC 9.2  --  18.1*  NEUTROABS 7.5  --   --   HGB 13.5 15.3* 12.4  HCT 41.8 45.0 38.4  MCV 93.9  --  94.6  PLT 298  --  279   Cardiac Enzymes: No results for input(s): CKTOTAL, CKMB, CKMBINDEX, TROPONINI in the last 168 hours. BNP (last 3 results) No results for input(s): BNP in the last 8760  hours.  ProBNP (last 3 results) No results for input(s): PROBNP in the last 8760 hours.  CBG: No results for input(s): GLUCAP in the last 168 hours.  No results found for this or any previous visit (from the past 240 hour(s)).   Studies: Ct Head Wo Contrast  05/15/2015  CLINICAL DATA:  Status post fall.  Laceration of the right eye. EXAM: CT HEAD WITHOUT CONTRAST CT MAXILLOFACIAL WITHOUT CONTRAST TECHNIQUE: Multidetector CT imaging of the head and  maxillofacial structures were performed using the standard protocol without intravenous contrast. Multiplanar CT image reconstructions of the maxillofacial structures were also generated. COMPARISON:  None. FINDINGS: CT HEAD FINDINGS There is no evidence of mass effect, midline shift, or extra-axial fluid collections. There is no evidence of a space-occupying lesion or intracranial hemorrhage. There is no evidence of a cortical-based area of acute infarction. There is generalized cerebral atrophy. There is periventricular white matter low attenuation likely secondary to microangiopathy. The ventricles and sulci are appropriate for the patient's age. The basal cisterns are patent. Visualized portions of the orbits are unremarkable. The visualized portions of the paranasal sinuses and mastoid air cells are unremarkable. The osseous structures are unremarkable. CT MAXILLOFACIAL FINDINGS There is hyperdense material within the right globe most concerning for a a hemorrhagic choroidal detachment. The orbital walls are intact. The orbital floors are intact. The maxilla is intact. The mandible is intact. The zygomatic arches are intact. The nasal septum is midline. There is no nasal bone fracture. 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 There is degenerative disc disease with disc height loss at C2-3, C3-4, C4-5 and C5-6. The paranasal sinuses are clear. The visualized portions of the mastoid sinuses are well aerated. IMPRESSION: 1. No acute intracranial pathology. 2. 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 Ophthalmology consultation is recommended. Electronically Signed   By: Elige KoHetal  Patel   On: 05/15/2015 11:15   Dg Chest Port 1 View  05/16/2015  CLINICAL DATA:  Cough and hypertension EXAM: PORTABLE CHEST 1 VIEW COMPARISON:  None. FINDINGS: There is cardiomegaly. The pulmonary vascularity is within normal limits. There is patchy  airspace consolidation in the left base with minimal left effusion. No adenopathy evident. There is extensive atherosclerotic calcification in the aorta. IMPRESSION: Focal airspace consolidation left base. Concern for early pneumonia. No appreciable interstitial edema. There is cardiomegaly. The pulmonary vascularity appears within normal limits. There is extensive atherosclerotic calcification in the aorta. Electronically Signed   By: Bretta BangWilliam  Woodruff III M.D.   On: 05/16/2015 09:58   Ct Maxillofacial Wo Cm  05/15/2015  CLINICAL DATA:  Status post fall.  Laceration of the right eye. EXAM: CT HEAD WITHOUT CONTRAST CT MAXILLOFACIAL WITHOUT CONTRAST TECHNIQUE: Multidetector CT imaging of the head and maxillofacial structures were performed using the standard protocol without intravenous contrast. Multiplanar CT image reconstructions of the maxillofacial structures were also generated. COMPARISON:  None. FINDINGS: CT HEAD FINDINGS There is no evidence of mass effect, midline shift, or extra-axial fluid collections. There is no evidence of a space-occupying lesion or intracranial hemorrhage. There is no evidence of a cortical-based area of acute infarction. There is generalized cerebral atrophy. There is periventricular white matter low attenuation likely secondary to microangiopathy. The ventricles and sulci are appropriate for the patient's age. The basal cisterns are patent. Visualized portions of the orbits are unremarkable. The visualized portions of the paranasal sinuses and mastoid air cells are unremarkable. The osseous structures are unremarkable. CT MAXILLOFACIAL FINDINGS  There is hyperdense material within the right globe most concerning for a a hemorrhagic choroidal detachment. The orbital walls are intact. The orbital floors are intact. The maxilla is intact. The mandible is intact. The zygomatic arches are intact. The nasal septum is midline. There is no nasal bone fracture. There is moderate  osteoarthritis of the left temporomandibular joint. There is degenerative disc disease with disc height loss at C2-3, C3-4, C4-5 and C5-6. The paranasal sinuses are clear. The visualized portions of the mastoid sinuses are well aerated. IMPRESSION: 1. No acute intracranial pathology. 2. 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 Ophthalmology consultation is recommended. Electronically Signed   By: Elige Ko   On: 05/15/2015 11:15    Scheduled Meds: . atropine  1 drop Right Eye TID  . azithromycin  500 mg Intravenous Q24H  . cefTAZidime  2.475 mg Intravitreal To OR  . cefTRIAXone (ROCEPHIN)  IV  1 g Intravenous Q24H  . donepezil  10 mg Oral Daily  . ferrous sulfate  325 mg Oral Q breakfast  . latanoprost  1 drop Both Eyes QHS  . metoprolol  50 mg Oral Q12H  . neomycin-polymyxin b-dexamethasone  1 drop Right Eye TID  . ofloxacin  1 drop Right Eye QID  . pantoprazole  40 mg Oral Daily  . prednisoLONE acetate  1 drop Right Eye 6 times per day  . senna  2 tablet Oral QHS  . vancomycin  50 mg Subconjunctival To OR   Continuous Infusions: . sodium chloride 10 mL/hr at 05/15/15 1737  . sodium chloride 75 mL/hr at 05/16/15 1043      Time spent: 25 MINUTES    Eddie North  [facility] Pager [phone] If 7PM-7AM, please contact night-coverage at [email or web address], password [email or web address] 05/16/2015, 1:03 PM

## 2015-05-16 NOTE — Progress Notes (Signed)
PT Cancellation Note  Patient Details Name: Crystal RuffHilda Hartman MRN: [id number] DOB: 04/10/1918   Cancelled Treatment:    Reason Eval/Treat Not Completed: Medical issues which prohibited therapy.  Attempted to see patient this am - patient agitated.  Will return in am for PT evaluation.   Vena AustriaDavis, Ira Dougher H 05/16/2015, 7:31 PM Durenda HurtSusan H. Renaldo Fiddleravis, PT, [facility] Acute Rehab Services Pager [phone]

## 2015-05-16 NOTE — Progress Notes (Signed)
Am dose of metoprolol 50 mg po given per MD order. HR 88

## 2015-05-16 NOTE — Evaluation (Signed)
Occupational Therapy Evaluation Patient Details Name: Crystal Hartman MRN: [id number] DOB: 10/29/1918 Today's Date: 05/16/2015    History of Present Illness 82 year old female with history of mild-to-moderate dementia, GERD, SVTs present of assist living presented to the ED after sustaining a fall. She sustained injury to her right eye after hitting a plastic can. Patient was found to have a right corneal laceration. Ophthalmology consulted and taken to or for repair. Post operatively patient has been extremely confused. Found to have increased leukocytosis and possible pneumonia.Pt. has oseoporosis with compression fx.Pt. was legally blind in L eye prior to recent fall that has damaged R eye.    Clinical Impression   Pt. Is now blind in R eye and was low vision/legally blind in L eye. Pt. Has history of dementia but cognition is much worse now with hallucinations. Pt. Is able to follow basic 1 step directions. Pt. Is requiring tactile and verbal cues secondary to low vision. Pt. Is requiring extensive A with ADLs currently secondary to weakness, cognition, and blind/low vision. Pt. Family wants her to d/c to SNF for rehab and most likely long term placement.     Follow Up Recommendations  SNF    Equipment Recommendations  None recommended by OT    Recommendations for Other Services       Precautions / Restrictions Precautions Precautions: Fall Restrictions Weight Bearing Restrictions: No      Mobility Bed Mobility Overal bed mobility: Needs Assistance Bed Mobility: Rolling;Supine to Sit Rolling: Mod assist   Supine to sit: Max assist        Transfers Overall transfer level: Needs assistance   Transfers: Stand Pivot Transfers   Stand pivot transfers: Mod assist            Balance                                            ADL Overall ADL's : Needs assistance/impaired Eating/Feeding: Total assistance   Grooming: Wash/dry hands;Wash/dry  face;Maximal assistance   Upper Body Bathing: Maximal assistance   Lower Body Bathing: Total assistance   Upper Body Dressing : Maximal assistance   Lower Body Dressing: Total assistance   Toilet Transfer: Maximal assistance           Functional mobility during ADLs: Moderate assistance General ADL Comments: Pt. is requiring extensive A secondary to vision, cognition and weakness.     Vision     Perception     Praxis      Pertinent Vitals/Pain Pain Assessment: No/denies pain     Hand Dominance     Extremity/Trunk Assessment Upper Extremity Assessment Upper Extremity Assessment: Overall WFL for tasks assessed;Generalized weakness           Communication Communication Communication: No difficulties   Cognition Arousal/Alertness: Awake/alert Behavior During Therapy: Anxious Overall Cognitive Status: Impaired/Different from baseline                     General Comments       Exercises       Shoulder Instructions      Home Living Family/patient expects to be discharged to:: Skilled nursing facility                                 Additional Comments:  (Pt. was in assisted living. )  Prior Functioning/Environment Level of Independence: Independent;Independent with assistive device(s)             OT Diagnosis: Generalized weakness;Cognitive deficits;Disturbance of vision   OT Problem List: Decreased strength;Decreased activity tolerance;Impaired vision/perception;Decreased cognition;Decreased safety awareness   OT Treatment/Interventions: Self-care/ADL training;Neuromuscular education;Therapeutic activities;Cognitive remediation/compensation;Visual/perceptual remediation/compensation;Patient/family education    OT Goals(Current goals can be found in the care plan section) Acute Rehab OT Goals Patient Stated Goal:  (Pt. family is agreeable to ST SNF) OT Goal Formulation: With patient/family Time For Goal Achievement:  05/30/15 Potential to Achieve Goals: Good  OT Frequency: Min 2X/week   Barriers to D/C: Decreased caregiver support          Co-evaluation              End of Session Equipment Utilized During Treatment: Gait belt  Activity Tolerance: Patient tolerated treatment well Patient left: in chair;with call bell/phone within reach;with family/visitor present   Time: 1400-1455 OT Time Calculation (min): 55 min Charges:  OT General Charges $OT Visit: 1 Procedure OT Evaluation $OT Eval Moderate Complexity: 1 Procedure OT Treatments $Self Care/Home Management : 8-22 mins $Therapeutic Activity: 23-37 mins G-Codes: OT G-codes **NOT FOR INPATIENT CLASS** Functional Limitation: Self care Self Care Current Status (Z6109): At least 80 percent but less than 100 percent impaired, limited or restricted Self Care Goal Status (U0454): At least 60 percent but less than 80 percent impaired, limited or restricted  Crystal Hartman 05/16/2015, 3:24 PM

## 2015-05-16 NOTE — Progress Notes (Signed)
BRIEF PROGRESS NOTE - OPHTHALMOLOGY  Crystal RuffHilda Hartman 05/16/2015  S: 82 y/o woman with traumatic open globe right eye now s/p repair (05/15/2015). POD1, pt very disoriented but comfortable. No pain.  O: Right eye formed with 9 cornea sutures intact, IOP 18, anterior chamber not visible due to hyphema and prolapsed uveal tissue (this is expected), no discharge or other evidence of infection, significant upper and lower lid ecchymosis  A/P:  1) Right eye corneal laceration s/p repair - POD1, looks good. - All drops administered by me this AM (atropine, pred, ofloxacin). - Please make sure that patient is discharged for prescriptions for each. - I have written orders for them while in hospital (duplicate?).  2) Glaucoma left eye - Continue glaucoma medications per outpatient regimen.  Pt to follow up with me or Dr. Georgann Housekeeperobert Handley [phone]), whom I will contact this morning regarding post-operative care. She will need to be seen again in about 1 week.  R Fabian SharpScott Tyja Gortney, MD

## 2015-05-16 NOTE — Progress Notes (Signed)
Patient's HR 157 irregular, pt alert with  no c/o chest pain stated she feels ok. MD notified with new orders.

## 2015-05-17 LAB — CBC
HEMATOCRIT: 37.6 % (ref 36.0–46.0)
HEMOGLOBIN: 11.7 g/dL — AB (ref 12.0–15.0)
MCH: 29.3 pg (ref 26.0–34.0)
MCHC: 31.1 g/dL (ref 30.0–36.0)
MCV: 94.2 fL (ref 78.0–100.0)
PLATELETS: 243 10*3/uL (ref 150–400)
RBC: 3.99 MIL/uL (ref 3.87–5.11)
RDW: 14.4 % (ref 11.5–15.5)
WBC: 10.7 10*3/uL — AB (ref 4.0–10.5)

## 2015-05-17 MED ORDER — HYDRALAZINE HCL 20 MG/ML IJ SOLN
5.0000 mg | Freq: Once | INTRAMUSCULAR | Status: AC
  Administered 2015-05-17: 5 mg via INTRAVENOUS
  Filled 2015-05-17: qty 1

## 2015-05-17 MED ORDER — ENOXAPARIN SODIUM 40 MG/0.4ML ~~LOC~~ SOLN
40.0000 mg | SUBCUTANEOUS | Status: DC
  Administered 2015-05-17 – 2015-05-19 (×3): 40 mg via SUBCUTANEOUS
  Filled 2015-05-17 (×3): qty 0.4

## 2015-05-17 MED ORDER — HYDRALAZINE HCL 10 MG PO TABS
10.0000 mg | ORAL_TABLET | Freq: Four times a day (QID) | ORAL | Status: DC | PRN
  Administered 2015-05-18: 10 mg via ORAL
  Filled 2015-05-17 (×2): qty 1

## 2015-05-17 MED ORDER — HYDRALAZINE HCL 10 MG PO TABS
10.0000 mg | ORAL_TABLET | Freq: Four times a day (QID) | ORAL | Status: DC | PRN
  Administered 2015-05-17: 10 mg via ORAL
  Filled 2015-05-17: qty 1

## 2015-05-17 MED ORDER — HYDRALAZINE HCL 10 MG PO TABS: 10.0000 mg | ORAL_TABLET | ORAL | Status: DC | PRN

## 2015-05-17 MED ORDER — FUROSEMIDE 40 MG PO TABS
40.0000 mg | ORAL_TABLET | Freq: Every day | ORAL | Status: DC
  Administered 2015-05-17 – 2015-05-19 (×3): 40 mg via ORAL
  Filled 2015-05-17 (×3): qty 1

## 2015-05-17 NOTE — Evaluation (Signed)
Physical Therapy Evaluation Patient Details Name: Crystal RuffHilda Wiegert MRN: [id number] DOB: 03/09/1918 Today's Date: 05/17/2015   History of Present Illness  82 year old female with history of mild-to-moderate dementia, GERD, SVTs present of assist living presented to the ED after sustaining a fall. She sustained injury to her right eye after hitting a plastic can. Patient was found to have a right corneal laceration. Ophthalmology consulted and taken to or for repair. Post operatively patient has been extremely confused. Found to have increased leukocytosis and possible pneumonia.Pt. has oseoporosis with compression fx.Pt. was legally blind in L eye prior to recent fall that has damaged R eye.   Clinical Impression  Patient presents with decreased mobility due to deficits listed in PT problem list.  She will benefit from skilled PT in the acute setting to allow return to ALF following SNF level rehab stay.    Follow Up Recommendations SNF    Equipment Recommendations  None recommended by PT    Recommendations for Other Services       Precautions / Restrictions Precautions Precautions: Fall Precaution Comments: legally blind L eye, R eye injury      Mobility  Bed Mobility Overal bed mobility: Needs Assistance Bed Mobility: Supine to Sit     Supine to sit: Max assist     General bed mobility comments: assist to bring legs off bed and to  lift trunk upright  Transfers Overall transfer level: Needs assistance   Transfers: Sit to/from Stand;Stand Pivot Transfers Sit to Stand: Mod assist Stand pivot transfers: Mod assist;Max assist       General transfer comment: assist to stand pt fearful, cues to hold onto PT and [facility], cues for R leg positioning due to internally rotated and extended with valgus at knee; then to chair utilized walker; had posterior bias and needed help to guide walker to pivot to sit on recliner  Ambulation/Gait                Stairs             Wheelchair Mobility    Modified Rankin (Stroke Patients Only)       Balance Overall balance assessment: Needs assistance   Sitting balance-Leahy Scale: Fair   Postural control: Posterior lean Standing balance support: Bilateral upper extremity supported Standing balance-Leahy Scale: Poor Standing balance comment: min/mod A with UE support for balance while being assisted for hygiene                             Pertinent Vitals/Pain Pain Assessment: Faces Faces Pain Scale: Hurts even more Pain Location: R knee with movement Pain Descriptors / Indicators: Grimacing;Discomfort;Guarding Pain Intervention(s): Repositioned;Monitored during session;Limited activity within patient's tolerance    Home Living Family/patient expects to be discharged to:: Skilled nursing facility                      Prior Function Level of Independence: Independent;Independent with assistive device(s)               Hand Dominance        Extremity/Trunk Assessment   Upper Extremity Assessment: Generalized weakness           Lower Extremity Assessment: RLE deficits/detail;LLE deficits/detail RLE Deficits / Details: AAROM knee flexion limited by pain to about 45, strength at least 3/5 knee exention, <3/5 hip flexion, ankle AROM WFL LLE Deficits / Details: AROM WFL, strength grossly 4-/5  Cervical /  Trunk Assessment: Kyphotic  Communication   Communication: No difficulties  Cognition Arousal/Alertness: Awake/alert Behavior During Therapy: Anxious Overall Cognitive Status: Impaired/Different from baseline Area of Impairment: Orientation;Memory;Problem solving Orientation Level: Disoriented to;Place;Time   Memory: Decreased short-term memory       Problem Solving: Slow processing;Decreased initiation;Requires verbal cues;Requires tactile cues      General Comments General comments (skin integrity, edema, etc.): bruising over both eyes, R with eye  shield    Exercises General Exercises - Lower Extremity Ankle Circles/Pumps: AROM;Both;5 reps;Supine Heel Slides: AROM;AAROM;Right;Left;5 reps;Supine      Assessment/Plan    PT Assessment Patient needs continued PT services  PT Diagnosis Difficulty walking;Generalized weakness   PT Problem List Decreased strength;Decreased cognition;Decreased range of motion;Decreased activity tolerance;Decreased mobility;Decreased knowledge of precautions;Pain  PT Treatment Interventions DME instruction;Gait training;Balance training;Functional mobility training;Patient/family education;Therapeutic activities;Therapeutic exercise   PT Goals (Current goals can be found in the Care Plan section) Acute Rehab PT Goals Patient Stated Goal: for rehab PT Goal Formulation: With patient/family Time For Goal Achievement: 05/24/15 Potential to Achieve Goals: Fair    Frequency Min 3X/week   Barriers to discharge        Co-evaluation               End of Session Equipment Utilized During Treatment: Gait belt Activity Tolerance: Patient limited by fatigue Patient left: in chair;with call bell/phone within reach;with chair alarm set;with family/visitor present           Time: 1137-1213 PT Time Calculation (min) (ACUTE ONLY): 36 min   Charges:   PT Evaluation $PT Eval Moderate Complexity: 1 Procedure PT Treatments $Therapeutic Activity: 8-22 mins   PT G CodesElray Mcgregor 05-23-15, 12:25 PM  Sheran Lawless, PT [phone] May 23, 2015

## 2015-05-17 NOTE — Progress Notes (Signed)
[facility] PROGRESS NOTE  Crystal Hartman ZOX:[id number] DOB: 06/16/1918 DOA: 05/15/2015 PCP: Crystal Hartman  Brief narrative 82 year old female with history of mild-to-moderate dementia, GERD, SVTs present of assist living presented to the ED after sustaining a fall. She sustained injury to her right eye after hitting a plastic can. Patient was found to have a right corneal laceration. Ophthalmology consulted and taken to OR for repair. Post operatively patient has been extremely confused. Found to have increased leukocytosis and possible pneumonia.   Assessment/Plan: Right corneal laceration Status post repair. Seen by ophthalmology this morning and appears stable. Have placed her on (Atropine, prednisone and maxitrol)  eyedrops. They plan to see her in the office in one week.  Acute encephalopathy Suspect due to left lobar pneumonia. Patient extremely confused and disoriented (At baseline she is quite mobile and communicative despite having underlying mild to moderate dementia). Had significant leukocytosis and chest x-ray showing left lobar consolidation. Family reported that she was coughing for the past few days.  Added empiric  IV Rocephin and azithromycin. Blood culture sent. UA and urine culture not yet sent. Gentle IV hydration. Ms. afebrile and WBC improved. Appears calm and better oriented today  Essential hypertension with History of SVTs Continue metoprolol. Holding Lasix. Also held aspirin on admission.  Mild to moderate dementia Continue Aricept.  Iron deficiency anemia Continue supplement.  Diet: Regular  DVT prophylaxis subcutaneous Lovenox  Code Status: DO NOT RESUSCITATE Family Communication: Son at bedside Disposition Plan: At evaluation. May need skilled nursing facility.   Consultants:  Ophthalmology  Procedures:  CT head and maxillofacial  Right corneal laceration repair  Antibiotics: IV vancomycin and cefepime 1  IV Rocephin and  azithromycin 4/7  HPI/Subjective: Seen and examined. Patient is restless and very confused.  Objective: Filed Vitals:   05/16/15 1024 05/16/15 1821  BP:  141/73  Pulse: 69 63  Temp:  98.5 F (36.9 C)  Resp:  20    Intake/Output Summary (Last 24 hours) at 05/17/15 1121 Last data filed at 05/16/15 1900  Gross per 24 hour  Intake    120 ml  Output      0 ml  Net    120 ml   There were no vitals filed for this visit.  Exam:   General:  Elderly female lying in bed not in distress  HEENT: Patch over her right eye, moist mucosa  Cardiovascular: Normal S1 and S2, no murmurs rub or gallop  Respiratory: Clear bilaterally   Abdomen: soft, nondistended, nontender, bowel sounds present  Musculoskeletal: Warm, no edema   CNS: AAO 1-2  Data Reviewed: Basic Metabolic Panel:  Recent Labs Lab 05/15/15 0947 05/16/15 0533  NA 141 138  K 4.1 3.9  CL 103 103  CO2  --  24  GLUCOSE 135* 110*  BUN 16 14  CREATININE 0.90 1.10*  CALCIUM  --  8.8*  MG  --  1.8   Liver Function Tests: No results for input(s): AST, ALT, ALKPHOS, BILITOT, PROT, ALBUMIN in the last 168 hours. No results for input(s): LIPASE, AMYLASE in the last 168 hours. No results for input(s): AMMONIA in the last 168 hours. CBC:  Recent Labs Lab 05/15/15 0912 05/15/15 0947 05/16/15 0533 05/17/15 0755  WBC 9.2  --  18.1* 10.7*  NEUTROABS 7.5  --   --   --   HGB 13.5 15.3* 12.4 11.7*  HCT 41.8 45.0 38.4 37.6  MCV 93.9  --  94.6 94.2  PLT 298  --  279 243   Cardiac Enzymes: No results for input(s): CKTOTAL, CKMB, CKMBINDEX, TROPONINI in the last 168 hours. BNP (last 3 results) No results for input(s): BNP in the last 8760 hours.  ProBNP (last 3 results) No results for input(s): PROBNP in the last 8760 hours.  CBG: No results for input(s): GLUCAP in the last 168 hours.  No results found for this or any previous visit (from the past 240 hour(s)).   Studies: Dg Chest Port 1 View  05/16/2015   CLINICAL DATA:  Cough and hypertension EXAM: PORTABLE CHEST 1 VIEW COMPARISON:  None. FINDINGS: There is cardiomegaly. The pulmonary vascularity is within normal limits. There is patchy airspace consolidation in the left base with minimal left effusion. No adenopathy evident. There is extensive atherosclerotic calcification in the aorta. IMPRESSION: Focal airspace consolidation left base. Concern for early pneumonia. No appreciable interstitial edema. There is cardiomegaly. The pulmonary vascularity appears within normal limits. There is extensive atherosclerotic calcification in the aorta. Electronically Signed   By: Bretta Bang III M.D.   On: 05/16/2015 09:58    Scheduled Meds: . atropine  1 drop Right Eye TID  . azithromycin  500 mg Intravenous Q24H  . cefTRIAXone (ROCEPHIN)  IV  1 g Intravenous Q24H  . donepezil  10 mg Oral Daily  . ferrous sulfate  325 mg Oral Q breakfast  . latanoprost  1 drop Both Eyes QHS  . metoprolol  50 mg Oral Q12H  . neomycin-polymyxin b-dexamethasone  1 drop Right Eye TID  . ofloxacin  1 drop Right Eye QID  . pantoprazole  40 mg Oral Daily  . prednisoLONE acetate  1 drop Right Eye 6 times per day  . senna  2 tablet Oral QHS   Continuous Infusions: . sodium chloride 10 mL/hr at 05/15/15 1737      Time spent: 25 MINUTES    Eddie North  [facility] Pager [phone] If 7PM-7AM, please contact night-coverage at [email or web address], password [facility] 05/17/2015, 11:21 AM  LOS: 1 day

## 2015-05-17 NOTE — Clinical Social Work Note (Signed)
Clinical Social Work Assessment  Patient Details  Name: Crystal RuffHilda Hartman MRN: [id number] Date of Birth: 02/12/1918  Date of referral:  05/17/15               Reason for consult:  Facility Placement                Permission sought to share information with:  Facility Medical sales representativeContact Representative, Family Supports Permission granted to share information::  Yes, Verbal Permission Granted  Name::     Crystal Hartman  Agency::  [place] [facility]  Relationship::  son  Contact Information:  [phone]  Housing/Transportation Living arrangements for the past 2 months:  Assisted Living Facility Source of Information:  Adult Children Patient Interpreter Needed:  None Criminal Activity/Legal Involvement Pertinent to Current Situation/Hospitalization:  No - Comment as needed Significant Relationships:  Adult Children Lives with:  Self Do you feel safe going back to the place where you live?  No Need for family participation in patient care:  Yes (Comment)  Care giving concerns:  CSW received referral for possible SNF placement at time of discharge. Patient is disoriented. CSW spoke with patient's son regarding PT recommendation of SNF placement at time of discharge. Per patient's son, patient is currently unable to care for herself at home given patient's current physical needs and fall risk. Patient's son expressed understanding of PT recommendation and is agreeable to SNF placement at time of discharge. CSW to continue to follow and assist with discharge planning needs.   Social Worker assessment / plan:  CSW spoke with patient's son concerning [facility] before returning home.  Employment status:  Retired Health and safety inspectornsurance information:  Medicare PT Recommendations:  Skilled Nursing Facility Information / Referral to community resources:  Skilled Nursing Facility  Patient/Family's Response to care:  Patient's son recognizes need for patient to go to rehab before returning home and is  agreeable to a SNF. Patient reported preference for [facility].  Patient/Family's Understanding of and Emotional Response to Diagnosis, Current Treatment, and Prognosis:  Patient is realistic regarding therapy needs. No questions/concerns about plan or treatment.    Emotional Assessment Appearance:  Appears stated age Attitude/Demeanor/Rapport:  Unable to Assess Affect (typically observed):  Unable to Assess Orientation:  Oriented to Self Alcohol / Substance use:  Not Applicable Psych involvement (Current and /or in the community):  No (Comment)  Discharge Needs  Concerns to be addressed:  Care Coordination Readmission within the last 30 days:  No Current discharge risk:  None Barriers to Discharge:  Continued Medical Work up   [employer]ia S Charell Faulk, LCSWA 05/17/2015, 2:58 PM

## 2015-05-17 NOTE — [place] FL2 (Signed)
[place] MEDICAID FL2 LEVEL OF CARE SCREENING TOOL     IDENTIFICATION  Patient Name: Crystal RuffHilda Lins Birthdate: 06/09/1918 Sex: female Admission Date (Current Location): 05/15/2015  [facility]   Facility and Address:  [facility] [place], [address], [place], [place] [address]      Provider Number: [id number]  Attending Physician Name and Address:  Eddie NorthNishant Dhungel, MD  Relative Name and Phone Number:  Aneta Minshillip, son, [phone]    Current Level of Care: Hospital Recommended Level of Care: Skilled Nursing Facility Prior Approval Number:    Date Approved/Denied:   PASRR Number: [id number]  Discharge Plan: SNF    Current Diagnoses: Patient Active Problem List   Diagnosis Date Noted  . Recurrent falls 05/16/2015  . GERD (gastroesophageal reflux disease) 05/16/2015  . Essential hypertension 05/16/2015  . Corneal injury of right eye 05/16/2015  . Corneal laceration 05/16/2015  . Other complication of corneal transplant 05/15/2015  . Eye pain 05/15/2015    Orientation RESPIRATION BLADDER Height & Weight     Self  Normal Incontinent Weight:   Height:  5\' 5"  (165.1 cm)  BEHAVIORAL SYMPTOMS/MOOD NEUROLOGICAL BOWEL NUTRITION STATUS      Continent Diet (Please see DC summary)  AMBULATORY STATUS COMMUNICATION OF NEEDS Skin   Extensive Assist Verbally Surgical wounds (Closed incision on eye)                       Personal Care Assistance Level of Assistance  Bathing, Feeding, Dressing Bathing Assistance: Maximum assistance Feeding assistance: Limited assistance Dressing Assistance: Maximum assistance     Functional Limitations Info             SPECIAL CARE FACTORS FREQUENCY  PT (By licensed PT), OT (By licensed OT)     PT Frequency: min 3x/week OT Frequency: min 2x/week            Contractures      Additional Factors Info  Code Status, Allergies Code Status Info: DNR Allergies Info: Diltiazem,  Norvasc, Verapamil           Current Medications (05/17/2015):  This is the current hospital active medication list Current Facility-Administered Medications  Medication Dose Route Frequency Provider Last Rate Last Dose  . 0.9 %  sodium chloride infusion   Intravenous Continuous Lewie LoronJohn Germeroth, MD 10 mL/hr at 05/15/15 1737    . acetaminophen (TYLENOL) tablet 650 mg  650 mg Oral Q6H PRN Dorothea OgleIskra M Myers, MD       Or  . acetaminophen (TYLENOL) suppository 650 mg  650 mg Rectal Q6H PRN Dorothea OgleIskra M Myers, MD      . atropine 1 % ophthalmic solution 1 drop  1 drop Right Eye TID Rolly SalterPranav M Patel, MD   1 drop at 05/17/15 1134  . azithromycin (ZITHROMAX) 500 mg in dextrose 5 % 250 mL IVPB  500 mg Intravenous Q24H Nishant Dhungel, MD   500 mg at 05/17/15 1416  . cefTRIAXone (ROCEPHIN) 1 g in dextrose 5 % 50 mL IVPB  1 g Intravenous Q24H Nishant Dhungel, MD   1 g at 05/17/15 1416  . donepezil (ARICEPT) tablet 10 mg  10 mg Oral Daily Dorothea OgleIskra M Myers, MD   10 mg at 05/17/15 1125  . enoxaparin (LOVENOX) injection 40 mg  40 mg Subcutaneous Q24H Nishant Dhungel, MD   40 mg at 05/17/15 1214  . ferrous sulfate tablet 325 mg  325 mg Oral Q breakfast Dorothea OgleIskra M Myers,  MD   325 mg at 05/17/15 0824  . latanoprost (XALATAN) 0.005 % ophthalmic solution 1 drop  1 drop Both Eyes QHS Dorothea Ogle, MD   1 drop at 05/16/15 2125  . magnesium hydroxide (MILK OF MAGNESIA) suspension 30 mL  30 mL Oral QHS PRN Dorothea Ogle, MD      . metoprolol (LOPRESSOR) tablet 50 mg  50 mg Oral Q12H Dorothea Ogle, MD   50 mg at 05/17/15 1136  . neomycin-polymyxin b-dexamethasone (MAXITROL) ophthalmic suspension 1 drop  1 drop Right Eye TID Rolly Salter, MD   1 drop at 05/17/15 1126  . ofloxacin (OCUFLOX) 0.3 % ophthalmic solution 1 drop  1 drop Right Eye QID Rolly Salter, MD   1 drop at 05/17/15 1417  . ondansetron (ZOFRAN) tablet 4 mg  4 mg Oral Q6H PRN Dorothea Ogle, MD       Or  . ondansetron [facility]) injection 4 mg  4 mg Intravenous Q6H PRN  Dorothea Ogle, MD      . pantoprazole (PROTONIX) EC tablet 40 mg  40 mg Oral Daily Dorothea Ogle, MD   40 mg at 05/17/15 1126  . prednisoLONE acetate (PRED FORTE) 1 % ophthalmic suspension 1 drop  1 drop Right Eye 6 times per day Olivia Canter, MD   1 drop at 05/17/15 1134  . senna (SENOKOT) tablet 17.2 mg  2 tablet Oral QHS Dorothea Ogle, MD   17.2 mg at 05/16/15 2122     Discharge Medications: Please see discharge summary for a list of discharge medications.  Relevant Imaging Results:  Relevant Lab Results:   Additional Information SSN: [id number] [place], [place]

## 2015-05-17 NOTE — Clinical Social Work Placement (Signed)
   CLINICAL SOCIAL WORK PLACEMENT  NOTE  Date:  05/17/2015  Patient Details  Name: Crystal Hartman MRN: [id number] Date of Birth: 10/13/1918  Clinical Social Work is seeking post-discharge placement for this patient at the Skilled  Nursing Facility level of care (*CSW will initial, date and re-position this form in  chart as items are completed):  Yes   Patient/family provided with [place] Clinical Social Work Department's list of facilities offering this level of care within the geographic area requested by the patient (or if unable, by the patient's family).  Yes   Patient/family informed of their freedom to choose among providers that offer the needed level of care, that participate in Medicare, Medicaid or managed care program needed by the patient, have an available bed and are willing to accept the patient.  Yes   Patient/family informed of [place]'s ownership interest in [facility] and [facility], as well as of the fact that they are under no obligation to receive care at these facilities.  PASRR submitted to EDS on       PASRR number received on       Existing PASRR number confirmed on 05/17/15     FL2 transmitted to all facilities in geographic area requested by pt/family on 05/17/15     FL2 transmitted to all facilities within larger geographic area on       Patient informed that his/her managed care company has contracts with or will negotiate with certain facilities, including the following:            Patient/family informed of bed offers received.  Patient chooses bed at       Physician recommends and patient chooses bed at      Patient to be transferred to   on  .  Patient to be transferred to facility by       Patient family notified on   of transfer.  Name of family member notified:        PHYSICIAN Please sign FL2, Please sign DNR     Additional Comment:    _______________________________________________ Mearl LatinNadia S Sinclair [place],  LCSWA 05/17/2015, 3:03 PM

## 2015-05-18 NOTE — Progress Notes (Signed)
RN noted moderate amount of moist drainage and dried scabs from drainage overnight, on and around the patient's R eye.  RN placed eye gauze over eye under eye shield to absorb drainage, gauze has been changed three times during shift today, and is working well, eye staying much cleaner.

## 2015-05-18 NOTE — Progress Notes (Signed)
[facility] PROGRESS NOTE  Crystal Hartman ZOX:[id number] DOB: [birth year] DOA: 05/15/2015 PCP: Marylen Ponto, MD  Brief narrative 82 year old female with history of mild-to-moderate dementia, GERD, SVTs present of assist living presented to the ED after sustaining a fall. She sustained injury to her right eye after hitting a plastic can. Patient was found to have a right corneal laceration. Ophthalmology consulted and taken to OR for repair. Post operatively patient has been extremely confused. Found to have increased leukocytosis and possible pneumonia.   Assessment/Plan: Right corneal laceration Status post repair. Seen by ophthalmology next morning and appeared stable.. Have placed her on (Atropine, prednisone and maxitrol)  eyedrops. They plan to see her in the office sometime this week.  Acute encephalopathy Suspect due to left lobar pneumonia. Patient extremely confused and disoriented (At baseline she is quite mobile and communicative despite having underlying mild to moderate dementia). Had significant leukocytosis and chest x-ray showing left lobar consolidation. Family reported that she was coughing for the past few days.  - empiric  IV Rocephin and azithromycin.  WBC improved and better oriented, still not back to baseline.  Essential hypertension with History of SVTs Continue metoprolol. Blood pressure elevated. Resumed Lasix. Added when necessary hydralazine.  Mild to moderate dementia Continue Aricept.  Iron deficiency anemia Continue supplement.  Diet: Regular  DVT prophylaxis subcutaneous Lovenox  Code Status: DO NOT RESUSCITATE Family Communication: Daughter-in-law at bedside Disposition Plan: DT recommend skilled nursing facility. Family interested in going to [facility] SNF in [place]. Social work consulted.   Consultants:  Ophthalmology  Procedures:  CT head and maxillofacial  Right corneal laceration repair  Antibiotics: IV vancomycin and cefepime  1  IV Rocephin and azithromycin 4/7  HPI/Subjective: Seen and examined. Patient still has some confusion but improving. Elevated blood pressure yesterday   Objective: Filed Vitals:   05/18/15 0100 05/18/15 0445  BP: 162/86 149/83  Pulse:  75  Temp:  97.8 F (36.6 C)  Resp:  16    Intake/Output Summary (Last 24 hours) at 05/18/15 1140 Last data filed at 05/17/15 1900  Gross per 24 hour  Intake 677.33 ml  Output      0 ml  Net 677.33 ml   Filed Weights   05/17/15 1134  Weight: 70.3 kg (154 lb 15.7 oz)    Exam:   General:  Elderly female lying in bed not in distress  HEENT: Patch over her right eye, moist mucosa  Cardiovascular: Normal S1 and S2, no murmurs rub or gallop  Respiratory: Clear bilaterally   Abdomen: soft, nondistended, nontender, bowel sounds present  Musculoskeletal: Warm, no edema   CNS: AAO 1-2  Data Reviewed: Basic Metabolic Panel:  Recent Labs Lab 05/15/15 0947 05/16/15 0533  NA 141 138  K 4.1 3.9  CL 103 103  CO2  --  24  GLUCOSE 135* 110*  BUN 16 14  CREATININE 0.90 1.10*  CALCIUM  --  8.8*  MG  --  1.8   Liver Function Tests: No results for input(s): AST, ALT, ALKPHOS, BILITOT, PROT, ALBUMIN in the last 168 hours. No results for input(s): LIPASE, AMYLASE in the last 168 hours. No results for input(s): AMMONIA in the last 168 hours. CBC:  Recent Labs Lab 05/15/15 0912 05/15/15 0947 05/16/15 0533 05/17/15 0755  WBC 9.2  --  18.1* 10.7*  NEUTROABS 7.5  --   --   --   HGB 13.5 15.3* 12.4 11.7*  HCT 41.8 45.0 38.4 37.6  MCV 93.9  --  94.6  94.2  PLT 298  --  279 243   Cardiac Enzymes: No results for input(s): CKTOTAL, CKMB, CKMBINDEX, TROPONINI in the last 168 hours. BNP (last 3 results) No results for input(s): BNP in the last 8760 hours.  ProBNP (last 3 results) No results for input(s): PROBNP in the last 8760 hours.  CBG: No results for input(s): GLUCAP in the last 168 hours.  No results found for this or  any previous visit (from the past 240 hour(s)).   Studies: No results found.  Scheduled Meds: . atropine  1 drop Right Eye TID  . azithromycin  500 mg Intravenous Q24H  . cefTRIAXone (ROCEPHIN)  IV  1 g Intravenous Q24H  . donepezil  10 mg Oral Daily  . enoxaparin (LOVENOX) injection  40 mg Subcutaneous Q24H  . ferrous sulfate  325 mg Oral Q breakfast  . furosemide  40 mg Oral Daily  . latanoprost  1 drop Both Eyes QHS  . metoprolol  50 mg Oral Q12H  . neomycin-polymyxin b-dexamethasone  1 drop Right Eye TID  . ofloxacin  1 drop Right Eye QID  . pantoprazole  40 mg Oral Daily  . prednisoLONE acetate  1 drop Right Eye 6 times per day  . senna  2 tablet Oral QHS   Continuous Infusions: . sodium chloride 20 mL (05/17/15 1600)      Time spent: 25 MINUTES    Crystal Hartman  [facility] Pager [phone] If 7PM-7AM, please contact night-coverage at [email or web address], password [email or web address] 05/18/2015, 11:40 AM  LOS: 2 days

## 2015-05-19 DIAGNOSIS — S0531XD Ocular laceration without prolapse or loss of intraocular tissue, right eye, subsequent encounter: Secondary | ICD-10-CM

## 2015-05-19 DIAGNOSIS — G9340 Encephalopathy, unspecified: Secondary | ICD-10-CM | POA: Diagnosis present

## 2015-05-19 DIAGNOSIS — J181 Lobar pneumonia, unspecified organism: Secondary | ICD-10-CM | POA: Diagnosis present

## 2015-05-19 DIAGNOSIS — F0390 Unspecified dementia without behavioral disturbance: Secondary | ICD-10-CM | POA: Diagnosis present

## 2015-05-19 DIAGNOSIS — I10 Essential (primary) hypertension: Secondary | ICD-10-CM | POA: Diagnosis present

## 2015-05-19 MED ORDER — NEOMYCIN-POLYMYXIN-DEXAMETH 3.5-10000-0.1 OP SUSP: 1.0000 [drp] | Freq: Three times a day (TID) | OPHTHALMIC | Status: AC

## 2015-05-19 MED ORDER — PREDNISOLONE ACETATE 1 % OP SUSP: 1.0000 [drp] | OPHTHALMIC | Status: AC

## 2015-05-19 MED ORDER — ATROPINE SULFATE 1 % OP SOLN: 1.0000 [drp] | Freq: Three times a day (TID) | OPHTHALMIC | Status: AC

## 2015-05-19 MED ORDER — AMOXICILLIN-POT CLAVULANATE 875-125 MG PO TABS: 1.0000 | ORAL_TABLET | Freq: Two times a day (BID) | ORAL | Status: AC

## 2015-05-19 MED ORDER — HYDRALAZINE HCL 10 MG PO TABS: 10.0000 mg | ORAL_TABLET | Freq: Three times a day (TID) | ORAL | Status: AC

## 2015-05-19 MED ORDER — AZITHROMYCIN 250 MG PO TABS
500.0000 mg | ORAL_TABLET | Freq: Every day | ORAL | Status: DC
  Administered 2015-05-19: 500 mg via ORAL
  Filled 2015-05-19 (×2): qty 2

## 2015-05-19 MED ORDER — HYDRALAZINE HCL 10 MG PO TABS
10.0000 mg | ORAL_TABLET | Freq: Three times a day (TID) | ORAL | Status: DC
  Administered 2015-05-19 (×2): 10 mg via ORAL
  Filled 2015-05-19: qty 1

## 2015-05-19 MED ORDER — OFLOXACIN 0.3 % OP SOLN: 1.0000 [drp] | Freq: Four times a day (QID) | OPHTHALMIC | Status: AC

## 2015-05-19 NOTE — Progress Notes (Signed)
Pt. Transferred to [facility] via ambulance. Pt. Transported to stretcher safely. [facility]. IV left in for transfer. Patient's family states no questions/complaints.

## 2015-05-19 NOTE — Care Management Important Message (Signed)
Important Message  Patient Details  Name: Crystal Hartman MRN: [id number] Date of Birth: 12/10/1918   Medicare Important Message Given:  Yes    Christ Fullenwider Abena 05/19/2015, 1:01 PM

## 2015-05-19 NOTE — Clinical Social Work Placement (Signed)
   CLINICAL SOCIAL WORK PLACEMENT  NOTE  Date:  05/19/2015  Patient Details  Name: Crystal Hartman MRN: [id number] Date of Birth: 04/13/1918  Clinical Social Work is seeking post-discharge placement for this patient at the Skilled  Nursing Facility level of care (*CSW will initial, date and re-position this form in  chart as items are completed):  Yes   Patient/family provided with [place] Clinical Social Work Department's list of facilities offering this level of care within the geographic area requested by the patient (or if unable, by the patient's family).  Yes   Patient/family informed of their freedom to choose among providers that offer the needed level of care, that participate in Medicare, Medicaid or managed care program needed by the patient, have an available bed and are willing to accept the patient.  Yes   Patient/family informed of [place]'s ownership interest in [facility] and [facility], as well as of the fact that they are under no obligation to receive care at these facilities.  PASRR submitted to EDS on       PASRR number received on       Existing PASRR number confirmed on 05/17/15     FL2 transmitted to all facilities in geographic area requested by pt/family on 05/17/15     FL2 transmitted to all facilities within larger geographic area on       Patient informed that his/her managed care company has contracts with or will negotiate with certain facilities, including the following:        Yes   Patient/family informed of bed offers received.  [facility]     Physician recommends and patient chooses bed at      Patient to be transferred to [facility], [place] on 05/19/15.  Patient to be transferred to facility by [facility]     Patient family notified on 05/19/15 of transfer.  Name of family member notified:  Crystal Hartman     PHYSICIAN Please sign FL2, Please sign DNR     Additional Comment:     _______________________________________________ Rod MaeVaughn, Deetra Booton S, LCSW 05/19/2015, 11:28 AM

## 2015-05-19 NOTE — Progress Notes (Signed)
Physical Therapy Treatment Patient Details Name: Crystal Hartman MRN: [id number] DOB: November 09, 1918 Today's Date: 05/19/2015    History of Present Illness 82 year old female with history of mild-to-moderate dementia, GERD, SVTs presents with fall from ALF. She sustained right corneal laceration to her right eye after hitting a plastic can s/p repair. Post operatively patient has been extremely confused. Found to have increased leukocytosis and possible pneumonia.Pt. has oseoporosis with compression fx.Pt. was legally blind in L eye prior to recent fall that has damaged R eye.     PT Comments    Pt remains pleasantly confused with ability to increase transfers and gait today. Cues throughout not to pull at eye patch or lines with redirection to manage fidgeting. Pt with HR 140-150 with activity with RN present and aware and pt not demonstrating distress or pain. Will continue to follow to maximize function and activity. Granddgtr present throughout session.   Follow Up Recommendations  SNF;Supervision/Assistance - 24 hour     Equipment Recommendations       Recommendations for Other Services       Precautions / Restrictions Precautions Precautions: Fall Precaution Comments: legally blind L eye, R eye injury    Mobility  Bed Mobility Overal bed mobility: Needs Assistance Bed Mobility: Supine to Sit     Supine to sit: Max assist;+2 for physical assistance     General bed mobility comments: pt initiating trunk elevation but required assist to fully rise from surface, pivot legs to EOB and assist with pad for reciprocal scooting to EOB with assist for trunk control due to posterior lean  Transfers Overall transfer level: Needs assistance   Transfers: Sit to/from Stand Sit to Stand: Min assist         General transfer comment: min assist x 3 with hand over hand assist for placement, assist for anterior translation and rise from bed and BSC. pt incontinent of B&B with stand from bed  with 2 min standing for assist with pericare, sat on EOB prior to gait. pt again incontinent with gait with BSC pulled to pt with additional assist for pericare and linen change  Ambulation/Gait Ambulation/Gait assistance: Min assist;+2 safety/equipment Ambulation Distance (Feet): 15 Feet Assistive device: Rolling walker (2 wheeled) Gait Pattern/deviations: Step-through pattern;Trunk flexed;Shuffle   Gait velocity interpretation: Below normal speed for age/gender General Gait Details: 10', then 30' after seated rest. 2 person assist for safety, management of lines and chair to follow with pt incontinent of stool with gait and unaware   Stairs            Wheelchair Mobility    Modified Rankin (Stroke Patients Only)       Balance Overall balance assessment: Needs assistance   Sitting balance-Leahy Scale: Fair   Postural control: Posterior lean Standing balance support: Bilateral upper extremity supported Standing balance-Leahy Scale: Poor                      Cognition Arousal/Alertness: Awake/alert Behavior During Therapy: WFL for tasks assessed/performed Overall Cognitive Status: History of cognitive impairments - at baseline   Orientation Level: Disoriented to;Place;Time;Situation   Memory: Decreased short-term memory       Problem Solving: Slow processing;Decreased initiation;Requires verbal cues;Requires tactile cues      Exercises General Exercises - Lower Extremity Heel Slides: AROM;AAROM;Right;Left;10 reps;Supine Hip ABduction/ADduction: Both;10 reps;AAROM;Supine    General Comments        Pertinent Vitals/Pain Pain Score: 2  Pain Location: right knee Pain Descriptors / Indicators: Sore Pain  Intervention(s): Limited activity within patient's tolerance    Home Living                      Prior Function            PT Goals (current goals can now be found in the care plan section) Progress towards PT goals: Progressing toward  goals    Frequency       PT Plan Current plan remains appropriate    Co-evaluation             End of Session Equipment Utilized During Treatment: Gait belt Activity Tolerance: Patient tolerated treatment well Patient left: in chair;with call bell/phone within reach;with chair alarm set;with nursing/sitter in room;with family/visitor present     Time: [id number] PT Time Calculation (min) (ACUTE ONLY): 31 min  Charges:  $Gait Training: 8-22 mins $Therapeutic Exercise: 8-22 mins                    G Codes:      Delorse Lekabor, Sari Cogan Beth 05/19/2015, 8:53 AM Delaney MeigsMaija Tabor Reynald Woods, PT [phone]

## 2015-05-19 NOTE — Discharge Summary (Addendum)
Physician Discharge Summary  Crystal Hartman ZOX:[id number] DOB: [birth year] DOA: 05/15/2015  PCP: Marylen Ponto, MD  Admit date: 05/15/2015 Discharge date: 05/19/2015  Time spent: 35 minutes  Recommendations for Outpatient Follow-up:  1. Discharge to SNF 2. Patient will follow up with her family opthalmologist  Dr Georgann Housekeeper in [place] by later this week (as per son). 3. Completes 5 days of antibiotics for pneumonia on 05/20/2015 4. Please monitor BP and adjust medications if remains elevated.    Discharge Diagnoses:  Principal Problem:   Corneal injury of right eye   Active Problems:   Lobar pneumonia due to unspecified organism   Acute encephalopathy   Uncontrolled hypertension   Other complication of corneal transplant   Eye pain   Recurrent falls   GERD (gastroesophageal reflux disease)   Essential hypertension   Corneal laceration   Dementia without behavioral disturbance   Discharge Condition: fair  Diet recommendation: regular  Code status: DNR  Filed Weights   05/17/15 1134  Weight: 70.3 kg (154 lb 15.7 oz)    History of present illness:  82 year old female with history of mild-to-moderate dementia, GERD, SVTs present of assist living presented to the ED after sustaining a fall. She sustained injury to her right eye after hitting a plastic can. Patient was found to have a right corneal laceration. Ophthalmology consulted and taken to OR for repair. Post operatively patient has been extremely confused. Found to have increased leukocytosis and possible pneumonia.  Hospital Course:  Right corneal laceration Status post repair. Seen by ophthalmology next morning and appeared stable.. Have placed her on (Atropine, prednisone and maxitrol) eyedrops. Patient has an ophthalmologist in [place] ( Dr Georgann Housekeeper) who will see pt in next few days.  Acute encephalopathy Suspect due to left lobar pneumonia. Patient extremely confused and disoriented (At baseline she  is quite mobile and communicative despite having underlying mild to moderate dementia). Had significant leukocytosis and chest x-ray showing left lobar consolidation. Family reported that she was coughing for the past few days.  - given  empiric IV Rocephin and azithromycin.  discharge on oral augmenting to complete 5 day course. WBC improved and better oriented, close to baseline  Uncontrolled hypertension with History of SVTs Continue metoprolol. . Resumed Lasix. Given persistently elevated BP i have added low dose scheduled hydralazine. Monitor at the facility.  Mild to moderate dementia Continue Aricept.  Iron deficiency anemia Continue supplement.   Family Communication: son at bedside Disposition Plan: PT recommend skilled nursing facility.   Consultants:  Ophthalmology ( Dr Dione Booze)  Procedures:  CT head and maxillofacial  Right corneal laceration repair  Antibiotics: IV vancomycin and cefepime 1  IV Rocephin and azithromycin 4/7--4/10   Discharge Exam: Filed Vitals:   05/19/15 0612 05/19/15 1032  BP: 178/98 178/89  Pulse: 105 81  Temp: 97.5 F (36.4 C)   Resp: 16      General: Elderly female lying in bed not in distress  HEENT: Patch over her right eye, moist mucosa  Cardiovascular: Normal S1 and S2, no murmurs rub or gallop  Respiratory: Clear bilaterally   Abdomen: soft, nondistended, nontender, bowel sounds present  Musculoskeletal: Warm, no edema   CNS: AAO 1-2, close to baseline as per family Discharge Instructions    Current Discharge Medication List    START taking these medications   Details  amoxicillin-clavulanate (AUGMENTIN) 875-125 MG tablet Take 1 tablet by mouth 2 (two) times daily. Qty: 4 tablet, Refills: 0    atropine 1 % ophthalmic  solution Place 1 drop into the right eye 3 (three) times daily. Qty: 2 mL, Refills: 12    hydrALAZINE (APRESOLINE) 10 MG tablet Take 1 tablet (10 mg total) by mouth every 8 (eight)  hours. Qty: 90 tablet, Refills: 0    neomycin-polymyxin b-dexamethasone (MAXITROL) 3.5-10000-0.1 SUSP Place 1 drop into the right eye 3 (three) times daily. Qty: 1 Bottle, Refills: 0    ofloxacin (OCUFLOX) 0.3 % ophthalmic solution Place 1 drop into the right eye 4 (four) times daily. Qty: 5 mL, Refills: 0    prednisoLONE acetate (PRED FORTE) 1 % ophthalmic suspension Place 1 drop into the right eye every 4 (four) hours. Qty: 5 mL, Refills: 0      CONTINUE these medications which have NOT CHANGED   Details  aspirin EC 81 MG tablet Take 81 mg by mouth daily.    diclofenac sodium (VOLTAREN) 1 % GEL Apply 2 g topically 4 (four) times daily as needed (pain).    donepezil (ARICEPT) 10 MG tablet Take 10 mg by mouth daily.    ferrous sulfate 325 (65 FE) MG tablet Take 325 mg by mouth daily with breakfast.    furosemide (LASIX) 40 MG tablet Take 40 mg by mouth daily.    latanoprost (XALATAN) 0.005 % ophthalmic solution Place 1 drop into both eyes at bedtime.    magnesium hydroxide (MILK OF MAGNESIA) 400 MG/5ML suspension Take 30 mLs by mouth at bedtime as needed for mild constipation.    metoprolol (LOPRESSOR) 50 MG tablet Take 50 mg by mouth every 12 (twelve) hours.    NAPROXEN 375 MG TBEC EC tablet Take 375 mg by mouth 2 (two) times daily.    OVER THE COUNTER MEDICATION Take 1 Can by mouth 3 (three) times daily with meals. Mighty Shakes    pantoprazole (PROTONIX) 40 MG tablet Take 40 mg by mouth daily.    potassium chloride (MICRO-K) 10 MEQ CR capsule Take 20 mEq by mouth daily.    senna (SENOKOT) 8.6 MG TABS tablet Take 2 tablets by mouth at bedtime.    SIMBRINZA 1-0.2 % SUSP Place 1 drop into both eyes 3 (three) times daily.       Allergies  Allergen Reactions  . Diltiazem   . Norvasc [Amlodipine Besylate]   . Verapamil       The results of significant diagnostics from this hospitalization (including imaging, microbiology, ancillary and laboratory) are listed below  for reference.    Significant Diagnostic Studies: Ct Head Wo Contrast  05/15/2015  CLINICAL DATA:  Status post fall.  Laceration of the right eye. EXAM: CT HEAD WITHOUT CONTRAST CT MAXILLOFACIAL WITHOUT CONTRAST TECHNIQUE: Multidetector CT imaging of the head and maxillofacial structures were performed using the standard protocol without intravenous contrast. Multiplanar CT image reconstructions of the maxillofacial structures were also generated. COMPARISON:  None. FINDINGS: CT HEAD FINDINGS There is no evidence of mass effect, midline shift, or extra-axial fluid collections. There is no evidence of a space-occupying lesion or intracranial hemorrhage. There is no evidence of a cortical-based area of acute infarction. There is generalized cerebral atrophy. There is periventricular white matter low attenuation likely secondary to microangiopathy. The ventricles and sulci are appropriate for the patient's age. The basal cisterns are patent. Visualized portions of the orbits are unremarkable. The visualized portions of the paranasal sinuses and mastoid air cells are unremarkable. The osseous structures are unremarkable. CT MAXILLOFACIAL FINDINGS There is hyperdense material within the right globe most concerning for a a hemorrhagic choroidal detachment.  The orbital walls are intact. The orbital floors are intact. The maxilla is intact. The mandible is intact. The zygomatic arches are intact. The nasal septum is midline. There is no nasal bone fracture. 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 There is degenerative disc disease with disc height loss at C2-3, C3-4, C4-5 and C5-6. The paranasal sinuses are clear. The visualized portions of the mastoid sinuses are well aerated. IMPRESSION: 1. No acute intracranial pathology. 2. 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 Ophthalmology consultation is recommended. Electronically Signed    By: Elige Ko   On: 05/15/2015 11:15   Dg Chest Port 1 View  05/16/2015  CLINICAL DATA:  Cough and hypertension EXAM: PORTABLE CHEST 1 VIEW COMPARISON:  None. FINDINGS: There is cardiomegaly. The pulmonary vascularity is within normal limits. There is patchy airspace consolidation in the left base with minimal left effusion. No adenopathy evident. There is extensive atherosclerotic calcification in the aorta. IMPRESSION: Focal airspace consolidation left base. Concern for early pneumonia. No appreciable interstitial edema. There is cardiomegaly. The pulmonary vascularity appears within normal limits. There is extensive atherosclerotic calcification in the aorta. Electronically Signed   By: Bretta Bang III M.D.   On: 05/16/2015 09:58   Ct Maxillofacial Wo Cm  05/15/2015  CLINICAL DATA:  Status post fall.  Laceration of the right eye. EXAM: CT HEAD WITHOUT CONTRAST CT MAXILLOFACIAL WITHOUT CONTRAST TECHNIQUE: Multidetector CT imaging of the head and maxillofacial structures were performed using the standard protocol without intravenous contrast. Multiplanar CT image reconstructions of the maxillofacial structures were also generated. COMPARISON:  None. FINDINGS: CT HEAD FINDINGS There is no evidence of mass effect, midline shift, or extra-axial fluid collections. There is no evidence of a space-occupying lesion or intracranial hemorrhage. There is no evidence of a cortical-based area of acute infarction. There is generalized cerebral atrophy. There is periventricular white matter low attenuation likely secondary to microangiopathy. The ventricles and sulci are appropriate for the patient's age. The basal cisterns are patent. Visualized portions of the orbits are unremarkable. The visualized portions of the paranasal sinuses and mastoid air cells are unremarkable. The osseous structures are unremarkable. CT MAXILLOFACIAL FINDINGS There is hyperdense material within the right globe most concerning for a a  hemorrhagic choroidal detachment. The orbital walls are intact. The orbital floors are intact. The maxilla is intact. The mandible is intact. The zygomatic arches are intact. The nasal septum is midline. There is no nasal bone fracture. 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 There is degenerative disc disease with disc height loss at C2-3, C3-4, C4-5 and C5-6. The paranasal sinuses are clear. The visualized portions of the mastoid sinuses are well aerated. IMPRESSION: 1. No acute intracranial pathology. 2. 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 Ophthalmology consultation is recommended. Electronically Signed   By: Elige Ko   On: 05/15/2015 11:15    Microbiology: No results found for this or any previous visit (from the past 240 hour(s)).   Labs: Basic Metabolic Panel:  Recent Labs Lab 05/15/15 0947 05/16/15 0533  NA 141 138  K 4.1 3.9  CL 103 103  CO2  --  24  GLUCOSE 135* 110*  BUN 16 14  CREATININE 0.90 1.10*  CALCIUM  --  8.8*  MG  --  1.8   Liver Function Tests: No results for input(s): AST, ALT, ALKPHOS, BILITOT, PROT, ALBUMIN in the last 168 hours. No  results for input(s): LIPASE, AMYLASE in the last 168 hours. No results for input(s): AMMONIA in the last 168 hours. CBC:  Recent Labs Lab 05/15/15 0912 05/15/15 0947 05/16/15 0533 05/17/15 0755  WBC 9.2  --  18.1* 10.7*  NEUTROABS 7.5  --   --   --   HGB 13.5 15.3* 12.4 11.7*  HCT 41.8 45.0 38.4 37.6  MCV 93.9  --  94.6 94.2  PLT 298  --  279 243   Cardiac Enzymes: No results for input(s): CKTOTAL, CKMB, CKMBINDEX, TROPONINI in the last 168 hours. BNP: BNP (last 3 results) No results for input(s): BNP in the last 8760 hours.  ProBNP (last 3 results) No results for input(s): PROBNP in the last 8760 hours.  CBG: No results for input(s): GLUCAP in the last 168 hours.     Signed:  Eddie North MD.  [facility] 05/19/2015, 11:01 AM

## 2015-05-19 NOTE — Clinical Social Work Note (Signed)
Patient to be discharged to [facility]. Patient's son, Aneta Minshillip, updated regarding discharge. Patient to be admitted to room 401.  Report number: [id number]  Marcelline Deistmily Vincenza Dail, LCSW [phone] Orthopedics: [phone] Surgical: [phone]

## 2015-05-20 NOTE — Consult Note (Signed)
   [facility] CM [facility] Consult   05/20/2015  Leighton RuffHilda Hast 06/25/1918 [id number] Patient screened for potential [facility] Care Management services. [facility] under [facility]'s Medicare  plan. Patient is currently being discharge to [facility] for skilled nursing.  No current [facility] Care Management needs noted.  For questions, please contact:  Charlesetta ShanksVictoria Syanna Remmert, RN BSN CCM [facility] Liaison  [phone] business mobile phone Toll free office [phone]

## 2016-04-08 DEATH — deceased

## 2016-11-21 IMAGING — CT CT HEAD W/O CM
3 of 5 series · 15 of 47 positions shown, 18 images · non-contrast
Comparison: None.

CLINICAL DATA: Status post fall.  Laceration of the right eye.

EXAM:
CT HEAD WITHOUT CONTRAST
CT MAXILLOFACIAL WITHOUT CONTRAST
TECHNIQUE: Multidetector CT imaging of the head and maxillofacial structures
were performed using the standard protocol without intravenous
contrast. Multiplanar CT image reconstructions of the maxillofacial
structures were also generated.

[Series 3: facialbone 2.0 st · axial · 0.34mm/px · z∈[-198,-60]mm · 9 of 85 slices shown, 12 images]
[im 8/85  brain]
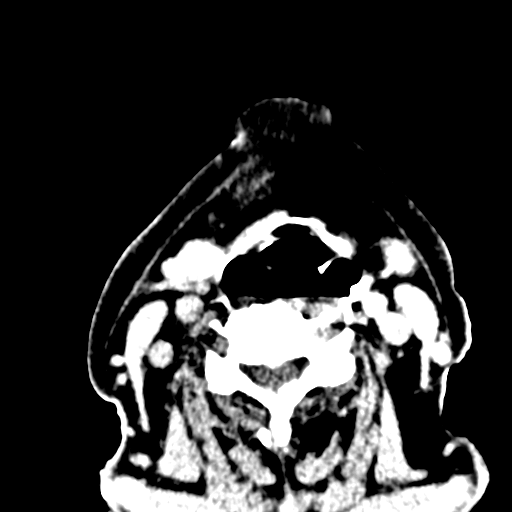
[im 8/85  bone]
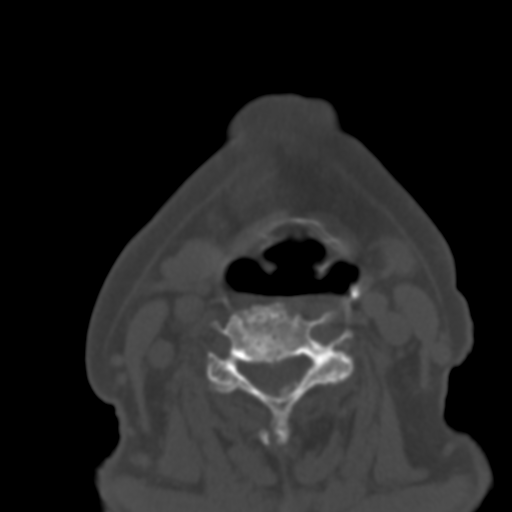
[im 16/85  brain]
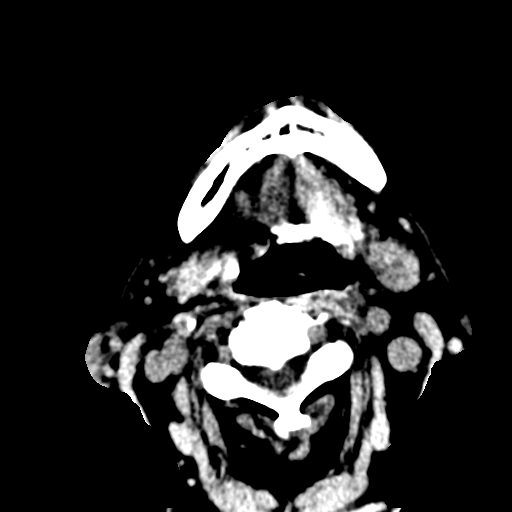
[im 23/85  brain]
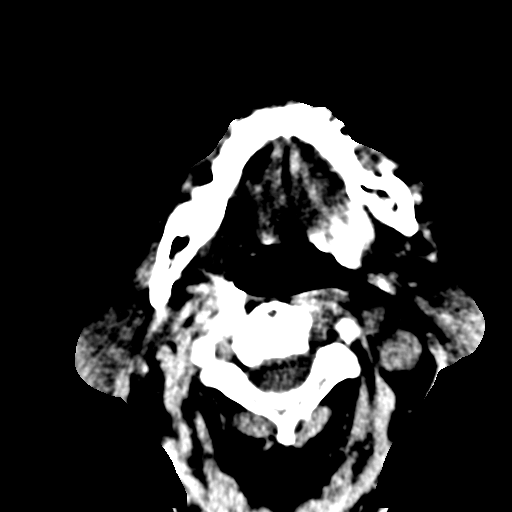
[im 31/85  brain]
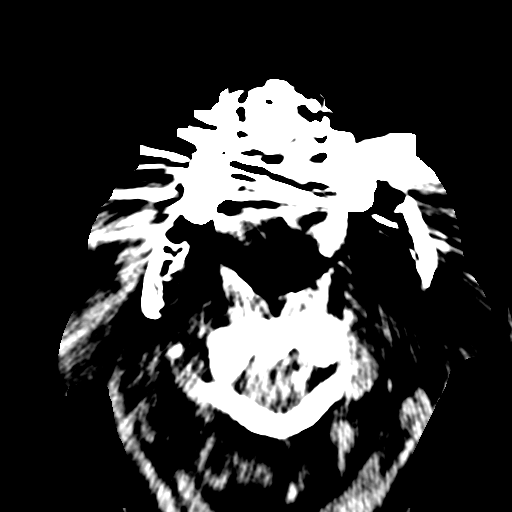
[im 46/85  brain]
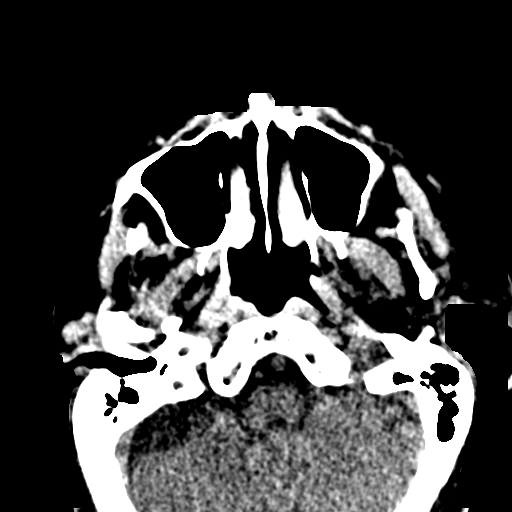
[im 46/85  bone]
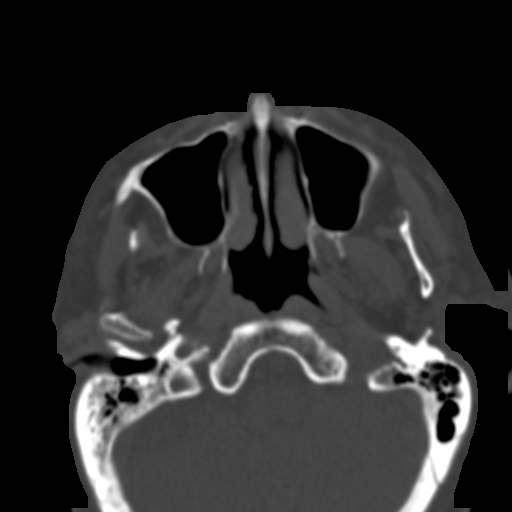
[im 54/85  brain]
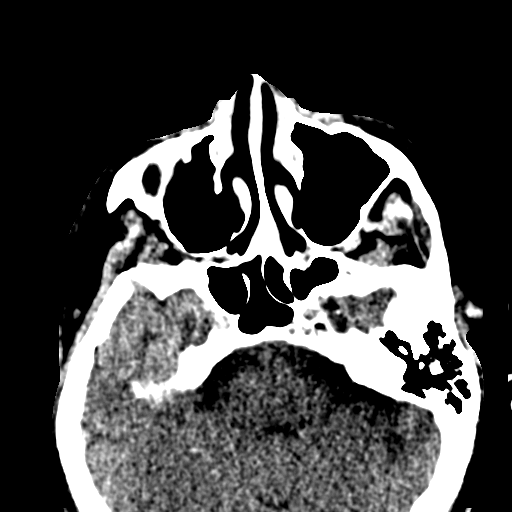
[im 62/85  brain]
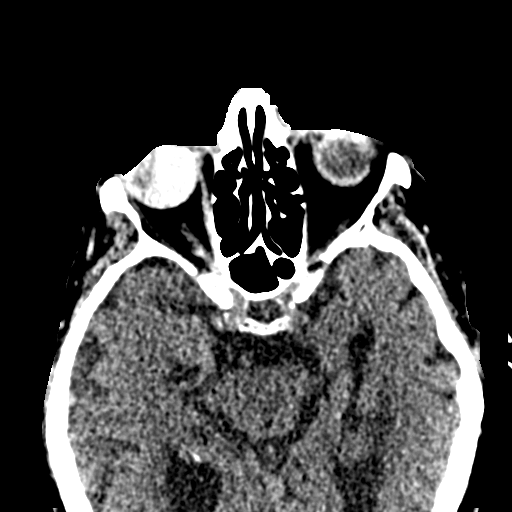
[im 69/85  brain]
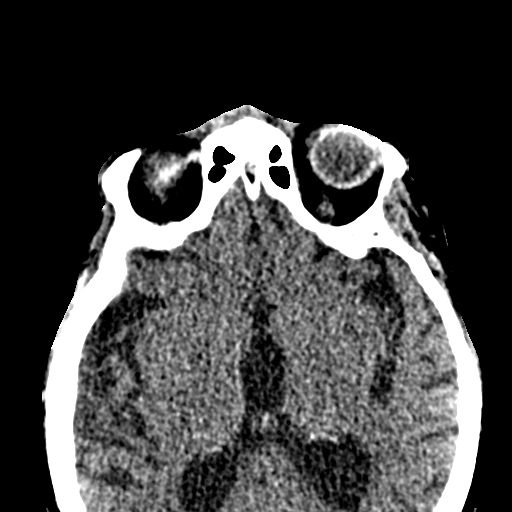
[im 77/85  brain]
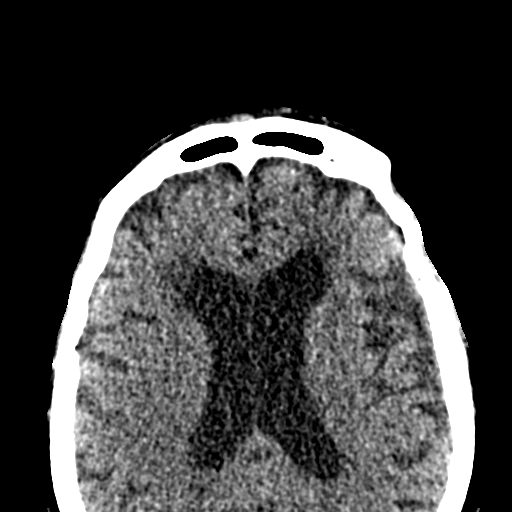
[im 77/85  bone]
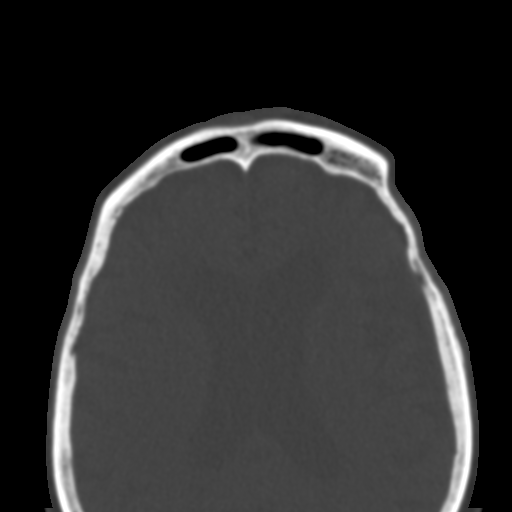

[Series 7: facialbone 2.0 cor st · coronal · 0.34mm/px · 3 of 84 slices shown]
[im 28/84  brain]
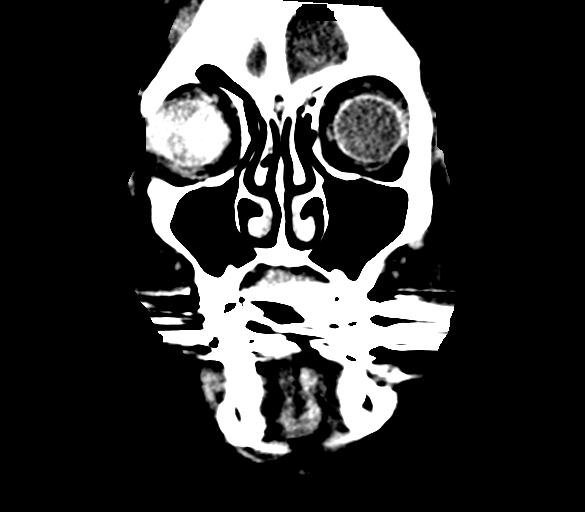
[im 37/84  brain]
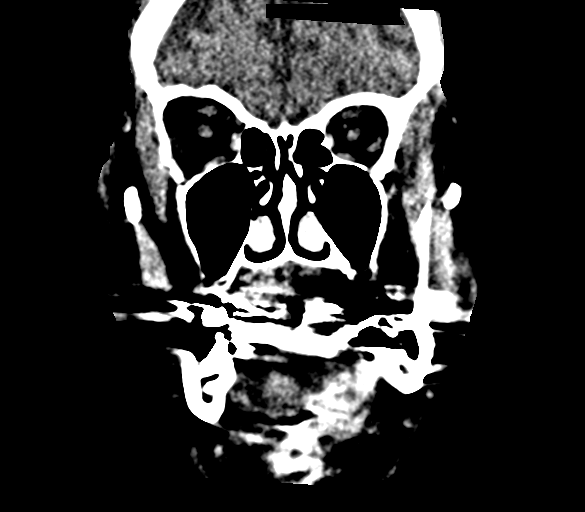
[im 47/84  brain]
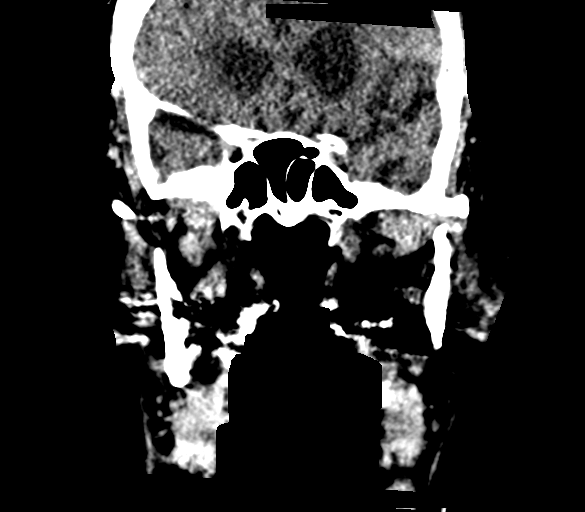

[Series 8: facialbone 2.0 sag st · sagittal · 0.34mm/px · 3 of 91 slices shown]
[im 31/91  brain]
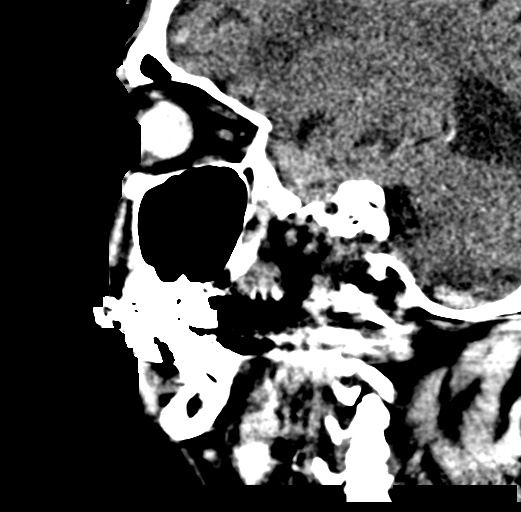
[im 46/91  brain]
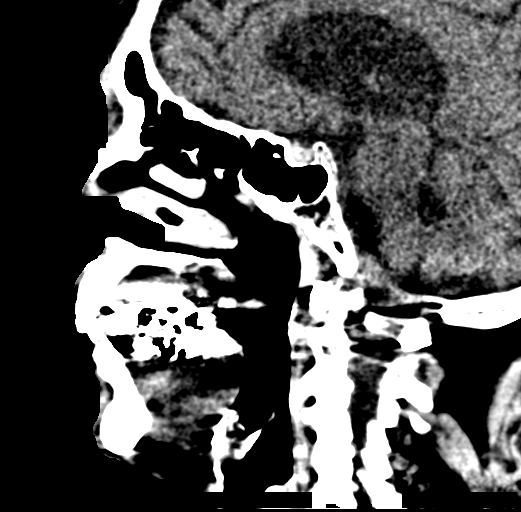
[im 61/91  brain]
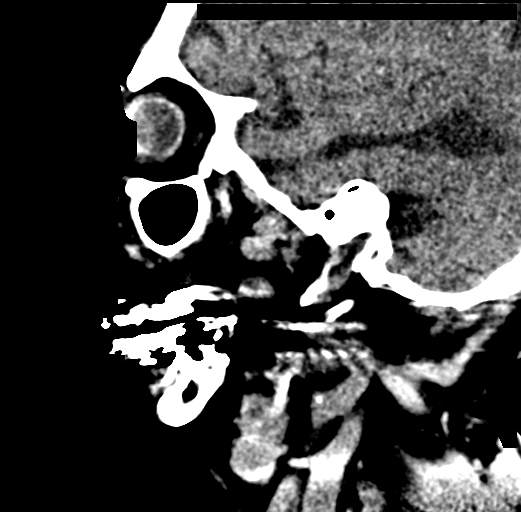

[15 of 47 positions shown; findings below may reference images not displayed]

FINDINGS: CT HEAD FINDINGS

There is no evidence of mass effect, midline shift, or extra-axial
fluid collections. There is no evidence of a space-occupying lesion
or intracranial hemorrhage. There is no evidence of a cortical-based
area of acute infarction. There is generalized cerebral atrophy.
There is periventricular white matter low attenuation likely
secondary to microangiopathy.

The ventricles and sulci are appropriate for the patient's age. The
basal cisterns are patent.

Visualized portions of the orbits are unremarkable. The visualized
portions of the paranasal sinuses and mastoid air cells are
unremarkable.

The osseous structures are unremarkable.

CT MAXILLOFACIAL FINDINGS

There is hyperdense material within the right globe most concerning
for a a hemorrhagic choroidal detachment. The orbital walls are
intact. The orbital floors are intact. The maxilla is intact. The
mandible is intact. The zygomatic arches are intact. The nasal
septum is midline. There is no nasal bone fracture. There is
moderate osteoarthritis of the left temporomandibular joint.

There is degenerative disc disease with disc height loss at C2-3,
C3-4, C4-5 and C5-6.

The paranasal sinuses are clear. The visualized portions of the
mastoid sinuses are well aerated.
IMPRESSION: 1. No acute intracranial pathology.
2. Hyperdense material within the right globe most concerning for a
hemorrhagic choroidal detachment. Ophthalmology consultation is
recommended.
